# Patient Record
Sex: Male | Born: 1945
Health system: Southern US, Community
[De-identification: ages and names within clinical notes are randomized; demographics above are authoritative.]

## PROBLEM LIST (undated history)

## (undated) DIAGNOSIS — G4730 Sleep apnea, unspecified: Secondary | ICD-10-CM

## (undated) DIAGNOSIS — T4145XA Adverse effect of unspecified anesthetic, initial encounter: Secondary | ICD-10-CM

## (undated) DIAGNOSIS — I10 Essential (primary) hypertension: Secondary | ICD-10-CM

## (undated) DIAGNOSIS — M1990 Unspecified osteoarthritis, unspecified site: Secondary | ICD-10-CM

## (undated) DIAGNOSIS — J189 Pneumonia, unspecified organism: Secondary | ICD-10-CM

## (undated) DIAGNOSIS — L409 Psoriasis, unspecified: Secondary | ICD-10-CM

## (undated) DIAGNOSIS — T8859XA Other complications of anesthesia, initial encounter: Secondary | ICD-10-CM

## (undated) DIAGNOSIS — C801 Malignant (primary) neoplasm, unspecified: Secondary | ICD-10-CM

## (undated) DIAGNOSIS — N189 Chronic kidney disease, unspecified: Secondary | ICD-10-CM

## (undated) DIAGNOSIS — R001 Bradycardia, unspecified: Secondary | ICD-10-CM

## (undated) DIAGNOSIS — L400 Psoriasis vulgaris: Secondary | ICD-10-CM

## (undated) HISTORY — PX: COLONOSCOPY: SHX174

## (undated) HISTORY — PX: OTHER SURGICAL HISTORY: SHX169

## (undated) HISTORY — PX: JOINT REPLACEMENT: SHX530

---

## 2003-01-03 ENCOUNTER — Encounter: Payer: Self-pay | Admitting: Geriatric Medicine

## 2003-01-03 ENCOUNTER — Encounter: Admission: RE | Admit: 2003-01-03 | Discharge: 2003-01-03 | Payer: Self-pay | Admitting: Geriatric Medicine

## 2003-05-09 ENCOUNTER — Encounter: Admission: RE | Admit: 2003-05-09 | Discharge: 2003-05-09 | Payer: Self-pay | Admitting: Geriatric Medicine

## 2011-09-10 DIAGNOSIS — Z79899 Other long term (current) drug therapy: Secondary | ICD-10-CM | POA: Diagnosis not present

## 2011-09-10 DIAGNOSIS — I10 Essential (primary) hypertension: Secondary | ICD-10-CM | POA: Diagnosis not present

## 2011-09-10 DIAGNOSIS — J309 Allergic rhinitis, unspecified: Secondary | ICD-10-CM | POA: Diagnosis not present

## 2011-09-10 DIAGNOSIS — M76899 Other specified enthesopathies of unspecified lower limb, excluding foot: Secondary | ICD-10-CM | POA: Diagnosis not present

## 2011-09-17 DIAGNOSIS — L821 Other seborrheic keratosis: Secondary | ICD-10-CM | POA: Diagnosis not present

## 2011-09-17 DIAGNOSIS — L408 Other psoriasis: Secondary | ICD-10-CM | POA: Diagnosis not present

## 2011-09-17 DIAGNOSIS — Z85820 Personal history of malignant melanoma of skin: Secondary | ICD-10-CM | POA: Diagnosis not present

## 2011-11-06 DIAGNOSIS — E7800 Pure hypercholesterolemia, unspecified: Secondary | ICD-10-CM | POA: Diagnosis not present

## 2011-11-06 DIAGNOSIS — I10 Essential (primary) hypertension: Secondary | ICD-10-CM | POA: Diagnosis not present

## 2011-11-06 DIAGNOSIS — Z79899 Other long term (current) drug therapy: Secondary | ICD-10-CM | POA: Diagnosis not present

## 2011-11-06 DIAGNOSIS — Z1331 Encounter for screening for depression: Secondary | ICD-10-CM | POA: Diagnosis not present

## 2011-11-06 DIAGNOSIS — Z125 Encounter for screening for malignant neoplasm of prostate: Secondary | ICD-10-CM | POA: Diagnosis not present

## 2011-11-06 DIAGNOSIS — Z0000 Encounter for general adult medical examination without abnormal findings: Secondary | ICD-10-CM | POA: Diagnosis not present

## 2011-12-02 DIAGNOSIS — Z79899 Other long term (current) drug therapy: Secondary | ICD-10-CM | POA: Diagnosis not present

## 2012-01-30 DIAGNOSIS — Z85820 Personal history of malignant melanoma of skin: Secondary | ICD-10-CM | POA: Diagnosis not present

## 2012-01-30 DIAGNOSIS — L723 Sebaceous cyst: Secondary | ICD-10-CM | POA: Diagnosis not present

## 2012-03-06 DIAGNOSIS — Z79899 Other long term (current) drug therapy: Secondary | ICD-10-CM | POA: Diagnosis not present

## 2012-03-06 DIAGNOSIS — Z23 Encounter for immunization: Secondary | ICD-10-CM | POA: Diagnosis not present

## 2012-03-06 DIAGNOSIS — E7800 Pure hypercholesterolemia, unspecified: Secondary | ICD-10-CM | POA: Diagnosis not present

## 2012-03-06 DIAGNOSIS — I10 Essential (primary) hypertension: Secondary | ICD-10-CM | POA: Diagnosis not present

## 2012-03-24 DIAGNOSIS — D485 Neoplasm of uncertain behavior of skin: Secondary | ICD-10-CM | POA: Diagnosis not present

## 2012-03-24 DIAGNOSIS — L723 Sebaceous cyst: Secondary | ICD-10-CM | POA: Diagnosis not present

## 2012-03-24 DIAGNOSIS — L821 Other seborrheic keratosis: Secondary | ICD-10-CM | POA: Diagnosis not present

## 2012-03-24 DIAGNOSIS — Z85820 Personal history of malignant melanoma of skin: Secondary | ICD-10-CM | POA: Diagnosis not present

## 2012-04-09 DIAGNOSIS — H524 Presbyopia: Secondary | ICD-10-CM | POA: Diagnosis not present

## 2012-05-04 DIAGNOSIS — Z79899 Other long term (current) drug therapy: Secondary | ICD-10-CM | POA: Diagnosis not present

## 2012-05-04 DIAGNOSIS — E7800 Pure hypercholesterolemia, unspecified: Secondary | ICD-10-CM | POA: Diagnosis not present

## 2012-05-04 DIAGNOSIS — M25569 Pain in unspecified knee: Secondary | ICD-10-CM | POA: Diagnosis not present

## 2012-05-04 DIAGNOSIS — I10 Essential (primary) hypertension: Secondary | ICD-10-CM | POA: Diagnosis not present

## 2012-06-03 DIAGNOSIS — E7800 Pure hypercholesterolemia, unspecified: Secondary | ICD-10-CM | POA: Diagnosis not present

## 2012-06-03 DIAGNOSIS — I10 Essential (primary) hypertension: Secondary | ICD-10-CM | POA: Diagnosis not present

## 2012-06-03 DIAGNOSIS — Z79899 Other long term (current) drug therapy: Secondary | ICD-10-CM | POA: Diagnosis not present

## 2012-09-22 DIAGNOSIS — D239 Other benign neoplasm of skin, unspecified: Secondary | ICD-10-CM | POA: Diagnosis not present

## 2012-09-22 DIAGNOSIS — L570 Actinic keratosis: Secondary | ICD-10-CM | POA: Diagnosis not present

## 2012-09-22 DIAGNOSIS — L821 Other seborrheic keratosis: Secondary | ICD-10-CM | POA: Diagnosis not present

## 2012-09-22 DIAGNOSIS — Z85820 Personal history of malignant melanoma of skin: Secondary | ICD-10-CM | POA: Diagnosis not present

## 2012-09-30 DIAGNOSIS — I10 Essential (primary) hypertension: Secondary | ICD-10-CM | POA: Diagnosis not present

## 2012-09-30 DIAGNOSIS — J069 Acute upper respiratory infection, unspecified: Secondary | ICD-10-CM | POA: Diagnosis not present

## 2012-11-06 DIAGNOSIS — Z1331 Encounter for screening for depression: Secondary | ICD-10-CM | POA: Diagnosis not present

## 2012-11-06 DIAGNOSIS — Z0000 Encounter for general adult medical examination without abnormal findings: Secondary | ICD-10-CM | POA: Diagnosis not present

## 2012-11-06 DIAGNOSIS — I10 Essential (primary) hypertension: Secondary | ICD-10-CM | POA: Diagnosis not present

## 2012-11-06 DIAGNOSIS — Z125 Encounter for screening for malignant neoplasm of prostate: Secondary | ICD-10-CM | POA: Diagnosis not present

## 2012-11-06 DIAGNOSIS — E7800 Pure hypercholesterolemia, unspecified: Secondary | ICD-10-CM | POA: Diagnosis not present

## 2012-11-06 DIAGNOSIS — Z79899 Other long term (current) drug therapy: Secondary | ICD-10-CM | POA: Diagnosis not present

## 2012-12-07 DIAGNOSIS — Z85820 Personal history of malignant melanoma of skin: Secondary | ICD-10-CM | POA: Diagnosis not present

## 2012-12-07 DIAGNOSIS — D485 Neoplasm of uncertain behavior of skin: Secondary | ICD-10-CM | POA: Diagnosis not present

## 2012-12-07 DIAGNOSIS — L821 Other seborrheic keratosis: Secondary | ICD-10-CM | POA: Diagnosis not present

## 2013-01-30 DIAGNOSIS — Z23 Encounter for immunization: Secondary | ICD-10-CM | POA: Diagnosis not present

## 2013-03-24 DIAGNOSIS — L821 Other seborrheic keratosis: Secondary | ICD-10-CM | POA: Diagnosis not present

## 2013-03-24 DIAGNOSIS — L408 Other psoriasis: Secondary | ICD-10-CM | POA: Diagnosis not present

## 2013-03-24 DIAGNOSIS — L851 Acquired keratosis [keratoderma] palmaris et plantaris: Secondary | ICD-10-CM | POA: Diagnosis not present

## 2013-03-24 DIAGNOSIS — L819 Disorder of pigmentation, unspecified: Secondary | ICD-10-CM | POA: Diagnosis not present

## 2013-03-24 DIAGNOSIS — Z85820 Personal history of malignant melanoma of skin: Secondary | ICD-10-CM | POA: Diagnosis not present

## 2013-03-24 DIAGNOSIS — D485 Neoplasm of uncertain behavior of skin: Secondary | ICD-10-CM | POA: Diagnosis not present

## 2013-05-06 DIAGNOSIS — H524 Presbyopia: Secondary | ICD-10-CM | POA: Diagnosis not present

## 2013-05-07 DIAGNOSIS — I10 Essential (primary) hypertension: Secondary | ICD-10-CM | POA: Diagnosis not present

## 2013-05-07 DIAGNOSIS — E7800 Pure hypercholesterolemia, unspecified: Secondary | ICD-10-CM | POA: Diagnosis not present

## 2013-05-07 DIAGNOSIS — Z79899 Other long term (current) drug therapy: Secondary | ICD-10-CM | POA: Diagnosis not present

## 2013-05-07 DIAGNOSIS — M25569 Pain in unspecified knee: Secondary | ICD-10-CM | POA: Diagnosis not present

## 2013-06-29 DIAGNOSIS — M1710 Unilateral primary osteoarthritis, unspecified knee: Secondary | ICD-10-CM | POA: Diagnosis not present

## 2013-11-01 DIAGNOSIS — Z85820 Personal history of malignant melanoma of skin: Secondary | ICD-10-CM | POA: Diagnosis not present

## 2013-11-01 DIAGNOSIS — L821 Other seborrheic keratosis: Secondary | ICD-10-CM | POA: Diagnosis not present

## 2013-11-01 DIAGNOSIS — L570 Actinic keratosis: Secondary | ICD-10-CM | POA: Diagnosis not present

## 2013-11-01 DIAGNOSIS — D239 Other benign neoplasm of skin, unspecified: Secondary | ICD-10-CM | POA: Diagnosis not present

## 2013-11-10 DIAGNOSIS — Z0000 Encounter for general adult medical examination without abnormal findings: Secondary | ICD-10-CM | POA: Diagnosis not present

## 2013-11-10 DIAGNOSIS — Z23 Encounter for immunization: Secondary | ICD-10-CM | POA: Diagnosis not present

## 2013-11-10 DIAGNOSIS — Z125 Encounter for screening for malignant neoplasm of prostate: Secondary | ICD-10-CM | POA: Diagnosis not present

## 2013-11-10 DIAGNOSIS — Z1331 Encounter for screening for depression: Secondary | ICD-10-CM | POA: Diagnosis not present

## 2013-11-10 DIAGNOSIS — I10 Essential (primary) hypertension: Secondary | ICD-10-CM | POA: Diagnosis not present

## 2013-11-10 DIAGNOSIS — G4733 Obstructive sleep apnea (adult) (pediatric): Secondary | ICD-10-CM | POA: Diagnosis not present

## 2013-11-10 DIAGNOSIS — Z79899 Other long term (current) drug therapy: Secondary | ICD-10-CM | POA: Diagnosis not present

## 2013-11-10 DIAGNOSIS — E7800 Pure hypercholesterolemia, unspecified: Secondary | ICD-10-CM | POA: Diagnosis not present

## 2014-01-12 ENCOUNTER — Ambulatory Visit (HOSPITAL_BASED_OUTPATIENT_CLINIC_OR_DEPARTMENT_OTHER): Payer: Medicare Other | Attending: Internal Medicine | Admitting: Radiology

## 2014-01-12 VITALS — Ht 69.0 in | Wt 184.0 lb

## 2014-01-12 DIAGNOSIS — R0989 Other specified symptoms and signs involving the circulatory and respiratory systems: Secondary | ICD-10-CM | POA: Diagnosis not present

## 2014-01-12 DIAGNOSIS — G4733 Obstructive sleep apnea (adult) (pediatric): Secondary | ICD-10-CM | POA: Insufficient documentation

## 2014-01-12 DIAGNOSIS — R5383 Other fatigue: Secondary | ICD-10-CM

## 2014-01-12 DIAGNOSIS — R0683 Snoring: Secondary | ICD-10-CM

## 2014-01-12 DIAGNOSIS — R0609 Other forms of dyspnea: Secondary | ICD-10-CM | POA: Insufficient documentation

## 2014-01-12 DIAGNOSIS — G4761 Periodic limb movement disorder: Secondary | ICD-10-CM | POA: Diagnosis not present

## 2014-01-12 DIAGNOSIS — G4710 Hypersomnia, unspecified: Secondary | ICD-10-CM | POA: Diagnosis present

## 2014-01-12 DIAGNOSIS — G4730 Sleep apnea, unspecified: Secondary | ICD-10-CM | POA: Diagnosis present

## 2014-01-12 DIAGNOSIS — R454 Irritability and anger: Secondary | ICD-10-CM

## 2014-01-16 DIAGNOSIS — G4710 Hypersomnia, unspecified: Secondary | ICD-10-CM

## 2014-01-16 NOTE — Sleep Study (Signed)
   NAME: Adam Daniel DATE OF BIRTH:  [birth year] MEDICAL RECORD NUMBER [id number]  LOCATION: [facility]  PHYSICIAN: YOUNG,CLINTON D  DATE OF STUDY: 01/12/2014  SLEEP STUDY TYPE: Nocturnal Polysomnogram               REFERRING PHYSICIAN: Stoneking, Hal, MD  INDICATION FOR STUDY: Hypersomnia with sleep apnea  EPWORTH SLEEPINESS SCORE:   12/24 HEIGHT: 5\' 9"  (175.3 cm)  WEIGHT: 83.462 kg (184 lb)    Body mass index is 27.16 kg/(m^2).  NECK SIZE: 15.5 in.  MEDICATIONS: Charted for review  SLEEP ARCHITECTURE: Total sleep time 285.5 minutes with sleep efficiency 75.5%. Stage I was 10.2%, stage II 78.6%, stage III absent, REM 11.2% of total sleep time. Sleep latency 37.5 minutes, REM latency 171.5 minutes, awake after sleep onset 55 minutes, arousal index 26.1, bedtime medication: None  RESPIRATORY DATA: Apnea hypopneas index (AHI) 34 per hour. 162 total events scored including 23 obstructive apneas, 34 central apneas, 1 mixed apnea, 104 hypopneas. The events in all positions, especially nonsupine. REM AHI 18.8 per hour. He had insufficient early events to meet protocol requirements for split CPAP titration on this study.  OXYGEN DATA: Moderately loud snoring with oxygen desaturation to a nadir of 85% and mean saturation 91.8% on room air.  CARDIAC DATA: Sinus rhythm with PVCs and PACs  MOVEMENT/PARASOMNIA: Periodic limb movement. A total of 266 limb jerks counted of which 17 were associated with arousal or wakening for periodic limb movement with arousal index of 3.6 per hour. Bathroom x2  IMPRESSION/ RECOMMENDATION:   1) Severe obstructive sleep apnea/hypopneas syndrome, AHI 34 per hour with non-positional events. REM AHI 18.8 per hour. Moderately loud snoring with oxygen desaturation to a nadir of 85% and mean saturation 91.8% on room air. 2) He did not have enough events in the first hours of the study to meet protocol criteria for split CPAP titration. This  patient can return for a dedicated CPAP titration study if appropriate. 3) Frequent limb jerks, meeting criteria for mild Periodic Limb Movement Syndrome. 266 limb jerks were counted of which 17 were associated with arousal or wakening for a periodic limb movement with arousal index of 3.6 per hour. Sometimes we find that limb jerks are markedly reduced when sleep apnea is controlled, indicating they are related to an arousal response triggered by respiratory events. If persistent in the home environment, then specific therapy such as Requip or Mirapex might be considered if appropriate. Noted also, bathroom x2 representing additional sleep disturbance.  Deneise Lever Diplomate, American Board of Sleep Medicine  ELECTRONICALLY SIGNED ON:  01/16/2014, 2:14 PM [place] PH: [phone]   FX: [phone] [place]

## 2014-03-04 ENCOUNTER — Other Ambulatory Visit: Payer: Self-pay | Admitting: Geriatric Medicine

## 2014-03-04 ENCOUNTER — Ambulatory Visit
Admission: RE | Admit: 2014-03-04 | Discharge: 2014-03-04 | Disposition: A | Discharge status: Home / Self Care | Payer: Medicare Other | Source: Ambulatory Visit | Attending: Geriatric Medicine | Admitting: Geriatric Medicine

## 2014-03-04 DIAGNOSIS — J069 Acute upper respiratory infection, unspecified: Secondary | ICD-10-CM | POA: Diagnosis not present

## 2014-03-04 DIAGNOSIS — R05 Cough: Secondary | ICD-10-CM

## 2014-03-04 DIAGNOSIS — R509 Fever, unspecified: Secondary | ICD-10-CM | POA: Diagnosis not present

## 2014-03-04 DIAGNOSIS — R059 Cough, unspecified: Secondary | ICD-10-CM

## 2014-03-13 DIAGNOSIS — B029 Zoster without complications: Secondary | ICD-10-CM | POA: Diagnosis not present

## 2014-04-06 ENCOUNTER — Ambulatory Visit (HOSPITAL_BASED_OUTPATIENT_CLINIC_OR_DEPARTMENT_OTHER): Payer: Medicare Other | Attending: Geriatric Medicine | Admitting: Sleep Medicine

## 2014-04-06 VITALS — Ht 69.0 in | Wt 184.0 lb

## 2014-04-06 DIAGNOSIS — G4710 Hypersomnia, unspecified: Secondary | ICD-10-CM | POA: Insufficient documentation

## 2014-04-06 DIAGNOSIS — Z6827 Body mass index (BMI) 27.0-27.9, adult: Secondary | ICD-10-CM | POA: Insufficient documentation

## 2014-04-06 DIAGNOSIS — G4730 Sleep apnea, unspecified: Secondary | ICD-10-CM | POA: Diagnosis not present

## 2014-04-06 DIAGNOSIS — G4733 Obstructive sleep apnea (adult) (pediatric): Secondary | ICD-10-CM

## 2014-04-10 DIAGNOSIS — G4733 Obstructive sleep apnea (adult) (pediatric): Secondary | ICD-10-CM | POA: Diagnosis not present

## 2014-04-10 NOTE — Sleep Study (Signed)
   NAME: Adam Daniel DATE OF BIRTH:  [birth year] MEDICAL RECORD NUMBER [id number]  LOCATION: [facility]  PHYSICIAN: Alandis Bluemel D  DATE OF STUDY: 04/06/2014  SLEEP STUDY TYPE: Nocturnal Polysomnogram               REFERRING PHYSICIAN: Stoneking, Hal, MD  INDICATION FOR STUDY: Hypersomnia with sleep apnea-CPAP titration  EPWORTH SLEEPINESS SCORE:   12/24 HEIGHT: 5\' 9"  (175.3 cm)  WEIGHT: 184 lb (83.462 kg)    Body mass index is 27.16 kg/(m^2).  NECK SIZE:   15.5 in.  MEDICATIONS: Charted for review  SLEEP ARCHITECTURE: Total sleep time 332.5 minutes with sleep efficiency 76.8%. Stage I was 4.1%, stage II 59.1%, stage III 15.8%, REM 21.1% of total sleep time. Sleep latency 22 minutes, REM latency 154 minutes, awake after sleep onset 75 minutes, arousal index 16.6, bedtime medication: None  RESPIRATORY DATA: CPAP titration protocol. CPAP was titrated to 9 CWP, AHI 0 per hour. He wore a medium fullface mask.  OXYGEN DATA: Snoring was prevented at final CPAP with mean oxygen saturation 94.1% on room air.  CARDIAC DATA: Sinus rhythm  MOVEMENT/PARASOMNIA: Periodic limb movement. A total of 498 limb jerks were counted of which 67 were associated with arousal or awakening for a periodic limb movement with arousal index of 12.1 per hour. Bathroom 2.  IMPRESSION/ RECOMMENDATION:   1) Successful CPAP titration to 9 CWP, AHI 0 per hour. He wore a medium ResMed fullface mask with heated humidifier. Snoring was prevented and mean oxygen saturation was 94.1% on room air. 2) Significant periodic limb movement syndrome. A total of 498 limb jerks were counted of which 67 were associated with arousal or awakening for a periodic limb movement with arousal index of 12.1 per hour. The frequency of limb jerks often goes up during CPAP titration. If this pattern persists and the home environment once he has adapted to CPAP, then a trial specific therapy such as Requip or  Mirapex might be considered, if appropriate.  3) Baseline polysomnogram on 01/12/2014 recorded AHI 34 per hour. Body weight was 184 pounds for that study.  Deneise Lever Diplomate, American Board of Sleep Medicine  ELECTRONICALLY SIGNED ON:  04/10/2014, 10:16 AM [place] PH: [phone]   FX: [phone] [place]

## 2014-05-18 DIAGNOSIS — G4733 Obstructive sleep apnea (adult) (pediatric): Secondary | ICD-10-CM | POA: Diagnosis not present

## 2014-05-18 DIAGNOSIS — I10 Essential (primary) hypertension: Secondary | ICD-10-CM | POA: Diagnosis not present

## 2014-05-18 DIAGNOSIS — L821 Other seborrheic keratosis: Secondary | ICD-10-CM | POA: Diagnosis not present

## 2014-07-20 DIAGNOSIS — I10 Essential (primary) hypertension: Secondary | ICD-10-CM | POA: Diagnosis not present

## 2014-07-20 DIAGNOSIS — G4730 Sleep apnea, unspecified: Secondary | ICD-10-CM | POA: Diagnosis not present

## 2014-08-09 ENCOUNTER — Encounter: Payer: Self-pay | Admitting: Internal Medicine

## 2014-11-10 DIAGNOSIS — D2272 Melanocytic nevi of left lower limb, including hip: Secondary | ICD-10-CM | POA: Diagnosis not present

## 2014-11-10 DIAGNOSIS — L821 Other seborrheic keratosis: Secondary | ICD-10-CM | POA: Diagnosis not present

## 2014-11-10 DIAGNOSIS — L400 Psoriasis vulgaris: Secondary | ICD-10-CM | POA: Diagnosis not present

## 2014-11-10 DIAGNOSIS — D225 Melanocytic nevi of trunk: Secondary | ICD-10-CM | POA: Diagnosis not present

## 2014-11-10 DIAGNOSIS — Z85820 Personal history of malignant melanoma of skin: Secondary | ICD-10-CM | POA: Diagnosis not present

## 2014-12-28 DIAGNOSIS — Z0000 Encounter for general adult medical examination without abnormal findings: Secondary | ICD-10-CM | POA: Diagnosis not present

## 2014-12-28 DIAGNOSIS — Z1389 Encounter for screening for other disorder: Secondary | ICD-10-CM | POA: Diagnosis not present

## 2014-12-28 DIAGNOSIS — G4730 Sleep apnea, unspecified: Secondary | ICD-10-CM | POA: Diagnosis not present

## 2014-12-28 DIAGNOSIS — M25561 Pain in right knee: Secondary | ICD-10-CM | POA: Diagnosis not present

## 2014-12-28 DIAGNOSIS — Z79899 Other long term (current) drug therapy: Secondary | ICD-10-CM | POA: Diagnosis not present

## 2014-12-28 DIAGNOSIS — H9319 Tinnitus, unspecified ear: Secondary | ICD-10-CM | POA: Diagnosis not present

## 2014-12-29 DIAGNOSIS — Z125 Encounter for screening for malignant neoplasm of prostate: Secondary | ICD-10-CM | POA: Diagnosis not present

## 2014-12-29 DIAGNOSIS — Z79899 Other long term (current) drug therapy: Secondary | ICD-10-CM | POA: Diagnosis not present

## 2014-12-29 DIAGNOSIS — Z0000 Encounter for general adult medical examination without abnormal findings: Secondary | ICD-10-CM | POA: Diagnosis not present

## 2015-01-23 DIAGNOSIS — Z79899 Other long term (current) drug therapy: Secondary | ICD-10-CM | POA: Diagnosis not present

## 2015-03-27 DIAGNOSIS — Z1389 Encounter for screening for other disorder: Secondary | ICD-10-CM | POA: Diagnosis not present

## 2015-03-27 DIAGNOSIS — Z79899 Other long term (current) drug therapy: Secondary | ICD-10-CM | POA: Diagnosis not present

## 2015-03-27 DIAGNOSIS — I10 Essential (primary) hypertension: Secondary | ICD-10-CM | POA: Diagnosis not present

## 2015-04-04 DIAGNOSIS — Z23 Encounter for immunization: Secondary | ICD-10-CM | POA: Diagnosis not present

## 2015-04-18 DIAGNOSIS — S0101XA Laceration without foreign body of scalp, initial encounter: Secondary | ICD-10-CM | POA: Diagnosis not present

## 2015-04-21 DIAGNOSIS — Z4802 Encounter for removal of sutures: Secondary | ICD-10-CM | POA: Diagnosis not present

## 2015-05-18 DIAGNOSIS — Z79899 Other long term (current) drug therapy: Secondary | ICD-10-CM | POA: Diagnosis not present

## 2015-06-02 DIAGNOSIS — M1711 Unilateral primary osteoarthritis, right knee: Secondary | ICD-10-CM | POA: Diagnosis not present

## 2015-07-05 DIAGNOSIS — M1711 Unilateral primary osteoarthritis, right knee: Secondary | ICD-10-CM | POA: Diagnosis not present

## 2015-09-25 DIAGNOSIS — N183 Chronic kidney disease, stage 3 (moderate): Secondary | ICD-10-CM | POA: Diagnosis not present

## 2015-09-25 DIAGNOSIS — I129 Hypertensive chronic kidney disease with stage 1 through stage 4 chronic kidney disease, or unspecified chronic kidney disease: Secondary | ICD-10-CM | POA: Diagnosis not present

## 2015-10-30 DIAGNOSIS — M1711 Unilateral primary osteoarthritis, right knee: Secondary | ICD-10-CM | POA: Diagnosis not present

## 2015-11-15 DIAGNOSIS — D1801 Hemangioma of skin and subcutaneous tissue: Secondary | ICD-10-CM | POA: Diagnosis not present

## 2015-11-15 DIAGNOSIS — D225 Melanocytic nevi of trunk: Secondary | ICD-10-CM | POA: Diagnosis not present

## 2015-11-15 DIAGNOSIS — D692 Other nonthrombocytopenic purpura: Secondary | ICD-10-CM | POA: Diagnosis not present

## 2015-11-15 DIAGNOSIS — L570 Actinic keratosis: Secondary | ICD-10-CM | POA: Diagnosis not present

## 2015-11-15 DIAGNOSIS — Z85820 Personal history of malignant melanoma of skin: Secondary | ICD-10-CM | POA: Diagnosis not present

## 2015-11-15 DIAGNOSIS — L821 Other seborrheic keratosis: Secondary | ICD-10-CM | POA: Diagnosis not present

## 2015-11-15 DIAGNOSIS — D485 Neoplasm of uncertain behavior of skin: Secondary | ICD-10-CM | POA: Diagnosis not present

## 2015-11-15 DIAGNOSIS — D2272 Melanocytic nevi of left lower limb, including hip: Secondary | ICD-10-CM | POA: Diagnosis not present

## 2015-12-20 DIAGNOSIS — H2513 Age-related nuclear cataract, bilateral: Secondary | ICD-10-CM | POA: Diagnosis not present

## 2016-01-04 DIAGNOSIS — B9789 Other viral agents as the cause of diseases classified elsewhere: Secondary | ICD-10-CM | POA: Diagnosis not present

## 2016-01-04 DIAGNOSIS — J069 Acute upper respiratory infection, unspecified: Secondary | ICD-10-CM | POA: Diagnosis not present

## 2016-01-04 DIAGNOSIS — Z23 Encounter for immunization: Secondary | ICD-10-CM | POA: Diagnosis not present

## 2016-01-04 DIAGNOSIS — Z0000 Encounter for general adult medical examination without abnormal findings: Secondary | ICD-10-CM | POA: Diagnosis not present

## 2016-01-04 DIAGNOSIS — E7800 Pure hypercholesterolemia, unspecified: Secondary | ICD-10-CM | POA: Diagnosis not present

## 2016-01-04 DIAGNOSIS — I129 Hypertensive chronic kidney disease with stage 1 through stage 4 chronic kidney disease, or unspecified chronic kidney disease: Secondary | ICD-10-CM | POA: Diagnosis not present

## 2016-01-04 DIAGNOSIS — N183 Chronic kidney disease, stage 3 (moderate): Secondary | ICD-10-CM | POA: Diagnosis not present

## 2016-01-04 DIAGNOSIS — Z79899 Other long term (current) drug therapy: Secondary | ICD-10-CM | POA: Diagnosis not present

## 2016-01-04 DIAGNOSIS — Z125 Encounter for screening for malignant neoplasm of prostate: Secondary | ICD-10-CM | POA: Diagnosis not present

## 2016-01-04 DIAGNOSIS — F325 Major depressive disorder, single episode, in full remission: Secondary | ICD-10-CM | POA: Diagnosis not present

## 2016-01-29 DIAGNOSIS — D490 Neoplasm of unspecified behavior of digestive system: Secondary | ICD-10-CM | POA: Diagnosis not present

## 2016-01-29 DIAGNOSIS — D120 Benign neoplasm of cecum: Secondary | ICD-10-CM | POA: Diagnosis not present

## 2016-01-29 DIAGNOSIS — K621 Rectal polyp: Secondary | ICD-10-CM | POA: Diagnosis not present

## 2016-01-29 DIAGNOSIS — Z86010 Personal history of colonic polyps: Secondary | ICD-10-CM | POA: Diagnosis not present

## 2016-01-29 DIAGNOSIS — D126 Benign neoplasm of colon, unspecified: Secondary | ICD-10-CM | POA: Diagnosis not present

## 2016-01-29 DIAGNOSIS — D123 Benign neoplasm of transverse colon: Secondary | ICD-10-CM | POA: Diagnosis not present

## 2016-01-30 DIAGNOSIS — D126 Benign neoplasm of colon, unspecified: Secondary | ICD-10-CM | POA: Diagnosis not present

## 2016-02-13 ENCOUNTER — Ambulatory Visit: Payer: Self-pay | Admitting: General Surgery

## 2016-02-13 DIAGNOSIS — D374 Neoplasm of uncertain behavior of colon: Secondary | ICD-10-CM | POA: Diagnosis not present

## 2016-02-13 NOTE — H&P (Signed)
Adam Daniel 02/13/2016 3:05 PM Location: [facility] Patient #: [id number] DOB: [birth year] Married / Language: English / Race: White Male   History of Present Illness Adam Hollingshead MD; 02/13/2016 4:22 PM) The patient is a 70 year old male.  Note:He is referred by Dr. Teena Irani for consultation regarding a 3 cm tubular adenoma with high-grade dysplasia in the transverse colon. He has a history of colon polyps and gets a colonoscopy every 5 years. Dr. Amedeo Plenty performed one this year and he found a mass in the transverse colon that was not obstructing. This was partially circumferential. It measured 3 cm in length. Biopsy was performed. The mass could not be completely removed given its nature. Final pathology is as above. The only family history of colon cancer was in his grandfather. Other polyps were found but these were benign. The area of the transverse colon lesion was marked with [place] ink. He is sent here to discuss partial colectomy. His wife and his daughter are with him. He does have approximately 3 bowel movements a day and sometimes does not empty completely.  Past medical history is notable for hypertension, sleep apnea, hypercholesterolemia, history of melanoma of the left shoulder  Other Problems (April Staton, CMA; 02/13/2016 3:05 PM) Arthritis Back Pain High blood pressure Hypercholesterolemia Melanoma Sleep Apnea  Past Surgical History (April Staton, CMA; 02/13/2016 3:05 PM) Colon Polyp Removal - Colonoscopy Oral Surgery  Diagnostic Studies History (April Staton, [place]; 02/13/2016 3:05 PM) Colonoscopy within last year  Allergies (April Staton, [place]; 02/13/2016 3:06 PM) No Known Drug Allergies10/24/2017  Medication History (April Staton, CMA; 02/13/2016 3:10 PM) AmLODIPine Besylate (10MG  Tablet, Oral) Active. Citalopram Hydrobromide (20MG  Tablet, Oral) Active. Rosuvastatin Calcium (5MG  Tablet, Oral) Active. Losartan  Potassium (100MG  Tablet, Oral) Active. Garlic (100MG  Tablet, Oral) Active. Cinnamon (500MG  Tablet, Oral) Active. Vitamin D (Cholecalciferol) (1000UNIT Tablet, Oral) Active. Loratadine (10MG  (Rapid) Tablet, Oral) Active. Levitra (10MG  Tablet, Oral) Active. Aspirin (81MG  Tablet, Oral) Active. Centrum Silver 50+Men (Oral) Active. Glucosamine Complex (1500 Oral) Active. CoQ10 (400MG  Capsule, Oral) Active. Triamcinolone Acetonide (Top) (0.025% Cream, External) Active. Flonase (50MCG/ACT Suspension, Nasal) Active. Medications Reconciled  Social History (April Staton, CMA; 02/13/2016 3:05 PM) Alcohol use Moderate alcohol use. Caffeine use Coffee. No drug use Tobacco use Never smoker.  Family History (April Staton, [place]; 02/13/2016 3:05 PM) Arthritis Father. Colon Polyps Brother, Sister. Diabetes Mellitus Brother, Father, Mother, Sister. Heart Disease Brother, Father, Mother, Sister. Heart disease in male family member before age 91 Hypertension Brother, Daughter, Father, Mother, Sister. Kidney Disease Sister. Migraine Headache Daughter. Prostate Cancer Father.    Review of Systems (April Staton CMA; 02/13/2016 3:05 PM) General Not Present- Appetite Loss, Chills, Fatigue, Fever, Night Sweats, Weight Gain and Weight Loss. Skin Not Present- Change in Wart/Mole, Dryness, Hives, Jaundice, New Lesions, Non-Healing Wounds, Rash and Ulcer. HEENT Present- Ringing in the Ears, Seasonal Allergies and Wears glasses/contact lenses. Not Present- Earache, Hearing Loss, Hoarseness, Nose Bleed, Oral Ulcers, Sinus Pain, Sore Throat, Visual Disturbances and Yellow Eyes. Respiratory Not Present- Bloody sputum, Chronic Cough, Difficulty Breathing, Snoring and Wheezing. Cardiovascular Not Present- Chest Pain, Difficulty Breathing Lying Down, Leg Cramps, Palpitations, Rapid Heart Rate, Shortness of Breath and Swelling of Extremities. Gastrointestinal Not Present- Abdominal Pain,  Bloating, Bloody Stool, Change in Bowel Habits, Chronic diarrhea, Constipation, Difficulty Swallowing, Excessive gas, Gets full quickly at meals, Hemorrhoids, Indigestion, Nausea, Rectal Pain and Vomiting. Male Genitourinary Not Present- Blood in Urine, Change in Urinary Stream, Frequency, Impotence, Nocturia, Painful Urination, Urgency and Urine  Leakage. Musculoskeletal Not Present- Back Pain, Joint Pain, Joint Stiffness, Muscle Pain, Muscle Weakness and Swelling of Extremities. Neurological Not Present- Decreased Memory, Fainting, Headaches, Numbness, Seizures, Tingling, Tremor, Trouble walking and Weakness. Psychiatric Not Present- Anxiety, Bipolar, Change in Sleep Pattern, Depression, Fearful and Frequent crying. Endocrine Not Present- Cold Intolerance, Excessive Hunger, Hair Changes, Heat Intolerance, Hot flashes and New Diabetes. Hematology Not Present- Blood Thinners, Easy Bruising, Excessive bleeding, Gland problems, HIV and Persistent Infections.  Vitals (April Staton CMA; 02/13/2016 3:11 PM) 02/13/2016 3:11 PM Weight: 187.25 lb Height: 69in Height was reported by patient. Body Surface Area: 2.01 m Body Mass Index: 27.65 kg/m  Temp.: 98.21F(Oral)  Pulse: 63 (Regular)  P.OX: 95% (Room air) BP: 130/74 (Sitting, Left Arm, Standard)       Physical Exam Adam Hollingshead MD; 02/13/2016 4:25 PM) The physical exam findings are as follows: Note:General: WDWN in NAD. Pleasant and cooperative.  HEENT: [place]/AT, no external nasal or ear masses, mucous membranes are moist  EYES: EOMI, no scleral icterus, pupils normal  NECK: Supple, no obvious mass or thyroid mass/enlargement, no trachea deviation  CV: RRR, no murmur, no edema  CHEST: Breath sounds equal and clear. Respirations nonlabored.  ABDOMEN: Soft, nontender, nondistended, no masses, no organomegaly, active bowel sounds, no scars, no hernias.  MUSCULOSKELETAL: FROM, good muscle tone, no edema, no venous stasis  changes, normal station and gait. left shoulder scar  LYMPHATIC: No palpable cervical, supraclavicular.  SKIN: No jaundice or suspicious rashes.  NEUROLOGIC: Alert and oriented, answers questions appropriately, normal gait and station.  PSYCHIATRIC: Normal mood, affect , and behavior.    Assessment & Plan Adam Hollingshead MD; 02/13/2016 4:25 PM) NEOPLASM OF UNCERTAIN BEHAVIOR OF TRANSVERSE COLON (D37.4) Impression: This is a tubulovillous adenoma with high-grade dysplasia. Given the size of the pathology, there is an increased risk of this harboring cancer. I recommend a laparoscopic assisted partial colectomy and he is in agreement with this.  Plan: Laparoscopic-assisted partial colectomy. I have explained the procedure, aftercare, and risks of colon resection. Risks include but are not limited to bleeding, infection, wound problems, anesthesia, anastomotic leak, need for colostomy, need for reoperative surgery, injury to intraabominal organs (such as intestine, spleen, kidney, bladder, ureter, etc.), ileus, irregular bowel habits. He seems to understand and agrees to proceed. Preoperative bowel preparation instructions will be given to him.  Jackolyn Confer, M.D.

## 2016-02-14 ENCOUNTER — Ambulatory Visit: Payer: Self-pay | Admitting: General Surgery

## 2016-03-06 DIAGNOSIS — M1711 Unilateral primary osteoarthritis, right knee: Secondary | ICD-10-CM | POA: Diagnosis not present

## 2016-03-07 ENCOUNTER — Ambulatory Visit
Admission: RE | Admit: 2016-03-07 | Discharge: 2016-03-07 | Disposition: A | Discharge status: Home / Self Care | Payer: Medicare Other | Source: Ambulatory Visit | Attending: Geriatric Medicine | Admitting: Geriatric Medicine

## 2016-03-07 ENCOUNTER — Other Ambulatory Visit: Payer: Self-pay | Admitting: Geriatric Medicine

## 2016-03-07 DIAGNOSIS — R918 Other nonspecific abnormal finding of lung field: Secondary | ICD-10-CM | POA: Diagnosis not present

## 2016-03-07 DIAGNOSIS — R001 Bradycardia, unspecified: Secondary | ICD-10-CM | POA: Diagnosis not present

## 2016-03-07 DIAGNOSIS — Z01818 Encounter for other preprocedural examination: Secondary | ICD-10-CM | POA: Diagnosis not present

## 2016-03-07 DIAGNOSIS — Z79899 Other long term (current) drug therapy: Secondary | ICD-10-CM | POA: Diagnosis not present

## 2016-03-25 NOTE — Patient Instructions (Addendum)
Adam Daniel  03/25/2016   Your procedure is scheduled on: 03/28/2016    Report to [facility] Main  Entrance take [place]  elevators to 3rd floor to  [place] at    [place] AM.  Call this number if you have problems the morning of surgery [phone]   Remember: ONLY 1 PERSON MAY GO WITH YOU TO SHORT STAY TO GET  READY MORNING OF [place].  Do not eat food or drink liquids :After Midnight.     Take these medicines the morning of surgery with A SIP OF WATER: Amlodipine ( NOrvasc), Celexa, Flonase                                 You may not have any metal on your body including hair pins and              piercings  Do not wear jewelry, , lotions, powders or perfumes, deodorant                         Men may shave face and neck.   Do not bring valuables to the hospital. [place].  Contacts, dentures or bridgework may not be worn into surgery.  Leave suitcase in the car. After surgery it may be brought to your room.        Special Instructions: N/A              Please read over the following fact sheets you were given: _____________________________________________________________________             [facility] - Preparing for Surgery Before surgery, you can play an important role.  Because skin is not sterile, your skin needs to be as free of germs as possible.  You can reduce the number of germs on your skin by washing with CHG (chlorahexidine gluconate) soap before surgery.  CHG is an antiseptic cleaner which kills germs and bonds with the skin to continue killing germs even after washing. Please DO NOT use if you have an allergy to CHG or antibacterial soaps.  If your skin becomes reddened/irritated stop using the CHG and inform your nurse when you arrive at Short Stay. Do not shave (including legs and underarms) for at least 48 hours prior to the first CHG shower.  You may shave your  face/neck. Please follow these instructions carefully:  1.  Shower with CHG Soap the night before surgery and the  morning of Surgery.  2.  If you choose to wash your hair, wash your hair first as usual with your  normal  shampoo.  3.  After you shampoo, rinse your hair and body thoroughly to remove the  shampoo.                           4.  Use CHG as you would any other liquid soap.  You can apply chg directly  to the skin and wash                       Gently with a scrungie or clean washcloth.  5.  Apply the CHG Soap  to your body ONLY FROM THE NECK DOWN.   Do not use on face/ open                           Wound or open sores. Avoid contact with eyes, ears mouth and genitals (private parts).                       Wash face,  Genitals (private parts) with your normal soap.             6.  Wash thoroughly, paying special attention to the area where your surgery  will be performed.  7.  Thoroughly rinse your body with warm water from the neck down.  8.  DO NOT shower/wash with your normal soap after using and rinsing off  the CHG Soap.                9.  Pat yourself dry with a clean towel.            10.  Wear clean pajamas.            11.  Place clean sheets on your bed the night of your first shower and do not  sleep with pets. Day of Surgery : Do not apply any lotions/deodorants the morning of surgery.  Please wear clean clothes to the hospital/surgery center.  FAILURE TO FOLLOW THESE INSTRUCTIONS MAY RESULT IN THE CANCELLATION OF YOUR SURGERY PATIENT SIGNATURE_________________________________  NURSE SIGNATURE__________________________________  ________________________________________________________________________  WHAT IS A BLOOD TRANSFUSION? Blood Transfusion Information  A transfusion is the replacement of blood or some of its parts. Blood is made up of multiple cells which provide different functions.  Red blood cells carry oxygen and are used for blood loss  replacement.  White blood cells fight against infection.  Platelets control bleeding.  Plasma helps clot blood.  Other blood products are available for specialized needs, such as hemophilia or other clotting disorders. BEFORE THE TRANSFUSION  Who gives blood for transfusions?   Healthy volunteers who are fully evaluated to make sure their blood is safe. This is blood bank blood. Transfusion therapy is the safest it has ever been in the practice of medicine. Before blood is taken from a donor, a complete history is taken to make sure that person has no history of diseases nor engages in risky social behavior (examples are intravenous drug use or sexual activity with multiple partners). The donor's travel history is screened to minimize risk of transmitting infections, such as malaria. The donated blood is tested for signs of infectious diseases, such as HIV and hepatitis. The blood is then tested to be sure it is compatible with you in order to minimize the chance of a transfusion reaction. If you or a relative donates blood, this is often done in anticipation of surgery and is not appropriate for emergency situations. It takes many days to process the donated blood. RISKS AND COMPLICATIONS Although transfusion therapy is very safe and saves many lives, the main dangers of transfusion include:   Getting an infectious disease.  Developing a transfusion reaction. This is an allergic reaction to something in the blood you were given. Every precaution is taken to prevent this. The decision to have a blood transfusion has been considered carefully by your caregiver before blood is given. Blood is not given unless the benefits outweigh the risks. AFTER THE TRANSFUSION  Right after receiving a blood transfusion, you will  usually feel much better and more energetic. This is especially true if your red blood cells have gotten low (anemic). The transfusion raises the level of the red blood cells which  carry oxygen, and this usually causes an energy increase.  The nurse administering the transfusion will monitor you carefully for complications. HOME CARE INSTRUCTIONS  No special instructions are needed after a transfusion. You may find your energy is better. Speak with your caregiver about any limitations on activity for underlying diseases you may have. SEEK MEDICAL CARE IF:   Your condition is not improving after your transfusion.  You develop redness or irritation at the intravenous (IV) site. SEEK IMMEDIATE MEDICAL CARE IF:  Any of the following symptoms occur over the next 12 hours:  Shaking chills.  You have a temperature by mouth above 102 F (38.9 C), not controlled by medicine.  Chest, back, or muscle pain.  People around you feel you are not acting correctly or are confused.  Shortness of breath or difficulty breathing.  Dizziness and fainting.  You get a rash or develop hives.  You have a decrease in urine output.  Your urine turns a dark color or changes to pink, red, or brown. Any of the following symptoms occur over the next 10 days:  You have a temperature by mouth above 102 F (38.9 C), not controlled by medicine.  Shortness of breath.  Weakness after normal activity.  The white part of the eye turns yellow (jaundice).  You have a decrease in the amount of urine or are urinating less often.  Your urine turns a dark color or changes to pink, red, or brown. Document Released: 04/05/2000 Document Revised: 07/01/2011 Document Reviewed: 11/23/2007 ExitCare Patient Information 2014 ExitCare, [place].  _______________________________________________________________________   CLEAR LIQUID DIET   Foods Allowed                                                                     Foods Excluded  Coffee and tea, regular and decaf                             liquids that you cannot  Plain Jell-O in any flavor                                             see  through such as: Fruit ices (not with fruit pulp)                                     milk, soups, orange juice  Iced Popsicles                                    All solid food Carbonated beverages, regular and diet  Cranberry, grape and apple juices Sports drinks like Gatorade Lightly seasoned clear broth or consume(fat free) Sugar, honey syrup  Sample Menu Breakfast                                Lunch                                     Supper Cranberry juice                    Beef broth                            Chicken broth Jell-O                                     Grape juice                           Apple juice Coffee or tea                        Jell-O                                      Popsicle                                                Coffee or tea                        Coffee or tea  _____________________________________________________________________

## 2016-03-26 ENCOUNTER — Encounter (HOSPITAL_COMMUNITY)
Admission: RE | Admit: 2016-03-26 | Discharge: 2016-03-26 | Disposition: A | Discharge status: Home / Self Care | Payer: Medicare Other | Source: Ambulatory Visit | Attending: General Surgery | Admitting: General Surgery

## 2016-03-26 ENCOUNTER — Encounter (HOSPITAL_COMMUNITY): Payer: Self-pay

## 2016-03-26 DIAGNOSIS — G4733 Obstructive sleep apnea (adult) (pediatric): Secondary | ICD-10-CM | POA: Diagnosis not present

## 2016-03-26 DIAGNOSIS — Z86010 Personal history of colonic polyps: Secondary | ICD-10-CM | POA: Diagnosis not present

## 2016-03-26 DIAGNOSIS — Z800 Family history of malignant neoplasm of digestive organs: Secondary | ICD-10-CM | POA: Diagnosis not present

## 2016-03-26 DIAGNOSIS — C184 Malignant neoplasm of transverse colon: Secondary | ICD-10-CM | POA: Diagnosis not present

## 2016-03-26 DIAGNOSIS — Z01812 Encounter for preprocedural laboratory examination: Secondary | ICD-10-CM

## 2016-03-26 DIAGNOSIS — Z01810 Encounter for preprocedural cardiovascular examination: Secondary | ICD-10-CM

## 2016-03-26 DIAGNOSIS — Z79899 Other long term (current) drug therapy: Secondary | ICD-10-CM | POA: Diagnosis not present

## 2016-03-26 DIAGNOSIS — D490 Neoplasm of unspecified behavior of digestive system: Secondary | ICD-10-CM | POA: Insufficient documentation

## 2016-03-26 DIAGNOSIS — M1990 Unspecified osteoarthritis, unspecified site: Secondary | ICD-10-CM | POA: Diagnosis not present

## 2016-03-26 DIAGNOSIS — N183 Chronic kidney disease, stage 3 (moderate): Secondary | ICD-10-CM | POA: Diagnosis not present

## 2016-03-26 DIAGNOSIS — I129 Hypertensive chronic kidney disease with stage 1 through stage 4 chronic kidney disease, or unspecified chronic kidney disease: Secondary | ICD-10-CM | POA: Diagnosis not present

## 2016-03-26 DIAGNOSIS — Z85820 Personal history of malignant melanoma of skin: Secondary | ICD-10-CM | POA: Diagnosis not present

## 2016-03-26 DIAGNOSIS — Z7982 Long term (current) use of aspirin: Secondary | ICD-10-CM | POA: Diagnosis not present

## 2016-03-26 HISTORY — DX: Malignant (primary) neoplasm, unspecified: C80.1

## 2016-03-26 HISTORY — DX: Unspecified osteoarthritis, unspecified site: M19.90

## 2016-03-26 HISTORY — DX: Essential (primary) hypertension: I10

## 2016-03-26 HISTORY — DX: Sleep apnea, unspecified: G47.30

## 2016-03-26 HISTORY — DX: Chronic kidney disease, unspecified: N18.9

## 2016-03-26 HISTORY — DX: Pneumonia, unspecified organism: J18.9

## 2016-03-26 HISTORY — DX: Psoriasis, unspecified: L40.9

## 2016-03-26 LAB — COMPREHENSIVE METABOLIC PANEL
ALT: 23 U/L (ref 17–63)
AST: 22 U/L (ref 15–41)
Albumin: 4.4 g/dL (ref 3.5–5.0)
Alkaline Phosphatase: 57 U/L (ref 38–126)
Anion gap: 8 (ref 5–15)
BILIRUBIN TOTAL: 0.9 mg/dL (ref 0.3–1.2)
BUN: 21 mg/dL — AB (ref 6–20)
CALCIUM: 9.1 mg/dL (ref 8.9–10.3)
CO2: 25 mmol/L (ref 22–32)
CREATININE: 1.39 mg/dL — AB (ref 0.61–1.24)
Chloride: 105 mmol/L (ref 101–111)
GFR, EST AFRICAN AMERICAN: 57 mL/min — AB (ref >60)
GFR, EST NON AFRICAN AMERICAN: 49 mL/min — AB (ref >60)
Glucose, Bld: 105 mg/dL — ABNORMAL HIGH (ref 65–99)
Potassium: 4.5 mmol/L (ref 3.5–5.1)
Sodium: 138 mmol/L (ref 135–145)
TOTAL PROTEIN: 7 g/dL (ref 6.5–8.1)

## 2016-03-26 LAB — CBC WITH DIFFERENTIAL/PLATELET
BASOS ABS: 0 10*3/uL (ref 0.0–0.1)
Basophils Relative: 0 %
Eosinophils Absolute: 0.1 10*3/uL (ref 0.0–0.7)
Eosinophils Relative: 2 %
HEMATOCRIT: 42.9 % (ref 39.0–52.0)
Hemoglobin: 14.4 g/dL (ref 13.0–17.0)
LYMPHS ABS: 1.5 10*3/uL (ref 0.7–4.0)
LYMPHS PCT: 25 %
MCH: 31.2 pg (ref 26.0–34.0)
MCHC: 33.6 g/dL (ref 30.0–36.0)
MCV: 92.9 fL (ref 78.0–100.0)
MONO ABS: 0.5 10*3/uL (ref 0.1–1.0)
Monocytes Relative: 8 %
NEUTROS ABS: 3.9 10*3/uL (ref 1.7–7.7)
Neutrophils Relative %: 65 %
Platelets: 292 10*3/uL (ref 150–400)
RBC: 4.62 MIL/uL (ref 4.22–5.81)
RDW: 13.2 % (ref 11.5–15.5)
WBC: 6 10*3/uL (ref 4.0–10.5)

## 2016-03-26 LAB — ABO/RH: ABO/RH(D): A NEG

## 2016-03-26 NOTE — Progress Notes (Signed)
Final EKG done 03/26/16- EPIC  

## 2016-03-26 NOTE — Progress Notes (Signed)
CMP done 03/26/16 faxed via EPIC to Dr Zella Richer.

## 2016-03-27 LAB — HEMOGLOBIN A1C
Hgb A1c MFr Bld: 5.3 % (ref 4.8–5.6)
MEAN PLASMA GLUCOSE: 105 mg/dL

## 2016-03-27 NOTE — Anesthesia Preprocedure Evaluation (Signed)
Anesthesia Evaluation  Patient identified by MRN, date of birth, ID band Patient awake    Reviewed: Allergy & Precautions, NPO status , Patient's Chart, lab work & pertinent test results  Airway Mallampati: II  TM Distance: >3 FB Neck ROM: Full    Dental no notable dental hx.    Pulmonary sleep apnea , pneumonia,    Pulmonary exam normal breath sounds clear to auscultation       Cardiovascular hypertension, negative cardio ROS Normal cardiovascular exam Rhythm:Regular Rate:Normal     Neuro/Psych negative neurological ROS  negative psych ROS   GI/Hepatic negative GI ROS, Neg liver ROS,   Endo/Other  negative endocrine ROS  Renal/GU Renal diseasenegative Renal ROS     Musculoskeletal  (+) Arthritis ,   Abdominal   Peds  Hematology negative hematology ROS (+)   Anesthesia Other Findings   Reproductive/Obstetrics negative OB ROS                             Anesthesia Physical Anesthesia Plan  ASA: II  Anesthesia Plan: General   Post-op Pain Management:    Induction: Intravenous  Airway Management Planned: Oral ETT  Additional Equipment:   Intra-op Plan:   Post-operative Plan: Extubation in OR  Informed Consent: I have reviewed the patients History and Physical, chart, labs and discussed the procedure including the risks, benefits and alternatives for the proposed anesthesia with the patient or authorized representative who has indicated his/her understanding and acceptance.   Dental advisory given  Plan Discussed with: CRNA  Anesthesia Plan Comments:         Anesthesia Quick Evaluation

## 2016-03-28 ENCOUNTER — Encounter (HOSPITAL_COMMUNITY): Payer: Self-pay | Admitting: *Deleted

## 2016-03-28 ENCOUNTER — Encounter (HOSPITAL_COMMUNITY)
Admission: RE | Disposition: A | Discharge status: Home / Self Care | Payer: Self-pay | Source: Ambulatory Visit | Attending: General Surgery

## 2016-03-28 ENCOUNTER — Inpatient Hospital Stay (HOSPITAL_COMMUNITY): Payer: Medicare Other | Admitting: Certified Registered Nurse Anesthetist

## 2016-03-28 ENCOUNTER — Inpatient Hospital Stay (HOSPITAL_COMMUNITY)
Admission: RE | Admit: 2016-03-28 | Discharge: 2016-03-31 | DRG: 331 | Disposition: A | Discharge status: Home / Self Care | Payer: Medicare Other | Source: Ambulatory Visit | Attending: General Surgery | Admitting: General Surgery

## 2016-03-28 DIAGNOSIS — K635 Polyp of colon: Secondary | ICD-10-CM | POA: Diagnosis present

## 2016-03-28 DIAGNOSIS — N183 Chronic kidney disease, stage 3 (moderate): Secondary | ICD-10-CM | POA: Diagnosis present

## 2016-03-28 DIAGNOSIS — C184 Malignant neoplasm of transverse colon: Secondary | ICD-10-CM | POA: Diagnosis not present

## 2016-03-28 DIAGNOSIS — Z86010 Personal history of colonic polyps: Secondary | ICD-10-CM

## 2016-03-28 DIAGNOSIS — Z800 Family history of malignant neoplasm of digestive organs: Secondary | ICD-10-CM

## 2016-03-28 DIAGNOSIS — M1990 Unspecified osteoarthritis, unspecified site: Secondary | ICD-10-CM | POA: Diagnosis present

## 2016-03-28 DIAGNOSIS — D123 Benign neoplasm of transverse colon: Secondary | ICD-10-CM | POA: Diagnosis present

## 2016-03-28 DIAGNOSIS — Z85820 Personal history of malignant melanoma of skin: Secondary | ICD-10-CM

## 2016-03-28 DIAGNOSIS — Z7982 Long term (current) use of aspirin: Secondary | ICD-10-CM | POA: Diagnosis not present

## 2016-03-28 DIAGNOSIS — C188 Malignant neoplasm of overlapping sites of colon: Secondary | ICD-10-CM | POA: Diagnosis not present

## 2016-03-28 DIAGNOSIS — C182 Malignant neoplasm of ascending colon: Secondary | ICD-10-CM | POA: Diagnosis not present

## 2016-03-28 DIAGNOSIS — I129 Hypertensive chronic kidney disease with stage 1 through stage 4 chronic kidney disease, or unspecified chronic kidney disease: Secondary | ICD-10-CM | POA: Diagnosis present

## 2016-03-28 DIAGNOSIS — I10 Essential (primary) hypertension: Secondary | ICD-10-CM | POA: Diagnosis not present

## 2016-03-28 DIAGNOSIS — Z79899 Other long term (current) drug therapy: Secondary | ICD-10-CM | POA: Diagnosis not present

## 2016-03-28 DIAGNOSIS — G4733 Obstructive sleep apnea (adult) (pediatric): Secondary | ICD-10-CM | POA: Diagnosis present

## 2016-03-28 HISTORY — PX: LAPAROSCOPIC PARTIAL COLECTOMY: SHX5907

## 2016-03-28 LAB — TYPE AND SCREEN
ABO/RH(D): A NEG
ANTIBODY SCREEN: NEGATIVE

## 2016-03-28 SURGERY — LAPAROSCOPIC PARTIAL COLECTOMY
Anesthesia: General

## 2016-03-28 MED ORDER — HYDROMORPHONE HCL 1 MG/ML IJ SOLN
0.2500 mg | INTRAMUSCULAR | Status: DC | PRN
  Administered 2016-03-28: 0.5 mg via INTRAVENOUS

## 2016-03-28 MED ORDER — MEPERIDINE HCL 50 MG/ML IJ SOLN: 6.2500 mg | INTRAMUSCULAR | Status: DC | PRN

## 2016-03-28 MED ORDER — ONDANSETRON HCL 4 MG/2ML IJ SOLN
INTRAMUSCULAR | Status: DC | PRN
  Administered 2016-03-28: 4 mg via INTRAVENOUS

## 2016-03-28 MED ORDER — SUCCINYLCHOLINE CHLORIDE 200 MG/10ML IV SOSY
PREFILLED_SYRINGE | INTRAVENOUS | Status: AC
  Filled 2016-03-28: qty 10

## 2016-03-28 MED ORDER — HYDROMORPHONE HCL 1 MG/ML IJ SOLN
INTRAMUSCULAR | Status: AC
  Filled 2016-03-28: qty 0.5

## 2016-03-28 MED ORDER — DIPHENHYDRAMINE HCL 50 MG/ML IJ SOLN: 12.5000 mg | Freq: Four times a day (QID) | INTRAMUSCULAR | Status: DC | PRN

## 2016-03-28 MED ORDER — FENTANYL CITRATE (PF) 100 MCG/2ML IJ SOLN
INTRAMUSCULAR | Status: AC
  Filled 2016-03-28: qty 2

## 2016-03-28 MED ORDER — ROCURONIUM BROMIDE 10 MG/ML (PF) SYRINGE
PREFILLED_SYRINGE | INTRAVENOUS | Status: DC | PRN
  Administered 2016-03-28: 10 mg via INTRAVENOUS
  Administered 2016-03-28: 50 mg via INTRAVENOUS
  Administered 2016-03-28: 10 mg via INTRAVENOUS

## 2016-03-28 MED ORDER — FLUTICASONE PROPIONATE 50 MCG/ACT NA SUSP: 2.0000 | Freq: Every day | NASAL | Status: DC | PRN

## 2016-03-28 MED ORDER — EPHEDRINE SULFATE-NACL 50-0.9 MG/10ML-% IV SOSY
PREFILLED_SYRINGE | INTRAVENOUS | Status: DC | PRN
  Administered 2016-03-28: 5 mg via INTRAVENOUS
  Administered 2016-03-28: 10 mg via INTRAVENOUS

## 2016-03-28 MED ORDER — ROCURONIUM BROMIDE 50 MG/5ML IV SOSY
PREFILLED_SYRINGE | INTRAVENOUS | Status: AC
  Filled 2016-03-28: qty 5

## 2016-03-28 MED ORDER — LIDOCAINE 2% (20 MG/ML) 5 ML SYRINGE
INTRAMUSCULAR | Status: AC
  Filled 2016-03-28: qty 5

## 2016-03-28 MED ORDER — ONDANSETRON HCL 4 MG/2ML IJ SOLN
4.0000 mg | INTRAMUSCULAR | Status: DC | PRN
Start: 2016-03-28 — End: 2016-03-31

## 2016-03-28 MED ORDER — ONDANSETRON HCL 4 MG/2ML IJ SOLN
INTRAMUSCULAR | Status: AC
  Filled 2016-03-28: qty 2

## 2016-03-28 MED ORDER — SODIUM CHLORIDE 0.9% FLUSH: 9.0000 mL | INTRAVENOUS | Status: DC | PRN

## 2016-03-28 MED ORDER — MORPHINE SULFATE 2 MG/ML IV SOLN
INTRAVENOUS | Status: DC
  Administered 2016-03-28: 12:00:00 via INTRAVENOUS
  Administered 2016-03-28: 10 mg via INTRAVENOUS
  Administered 2016-03-29: 4 mg via INTRAVENOUS
  Administered 2016-03-29: 17:00:00 via INTRAVENOUS
  Administered 2016-03-30: 2 mg via INTRAVENOUS
  Administered 2016-03-30: 0 mg via INTRAVENOUS
  Filled 2016-03-28 (×2): qty 25

## 2016-03-28 MED ORDER — DEXAMETHASONE SODIUM PHOSPHATE 10 MG/ML IJ SOLN
INTRAMUSCULAR | Status: DC | PRN
  Administered 2016-03-28: 10 mg via INTRAVENOUS

## 2016-03-28 MED ORDER — ONDANSETRON HCL 4 MG PO TABS: 4.0000 mg | ORAL_TABLET | Freq: Four times a day (QID) | ORAL | Status: DC | PRN

## 2016-03-28 MED ORDER — DIPHENHYDRAMINE HCL 12.5 MG/5ML PO ELIX: 12.5000 mg | ORAL_SOLUTION | Freq: Four times a day (QID) | ORAL | Status: DC | PRN

## 2016-03-28 MED ORDER — MIDAZOLAM HCL 5 MG/5ML IJ SOLN
INTRAMUSCULAR | Status: DC | PRN
  Administered 2016-03-28: 2 mg via INTRAVENOUS

## 2016-03-28 MED ORDER — KCL IN DEXTROSE-NACL 20-5-0.9 MEQ/L-%-% IV SOLN
INTRAVENOUS | Status: DC
  Administered 2016-03-28 – 2016-03-31 (×6): via INTRAVENOUS
  Filled 2016-03-28 (×8): qty 1000

## 2016-03-28 MED ORDER — PROPOFOL 10 MG/ML IV BOLUS
INTRAVENOUS | Status: DC | PRN
  Administered 2016-03-28: 170 mg via INTRAVENOUS

## 2016-03-28 MED ORDER — FENTANYL CITRATE (PF) 100 MCG/2ML IJ SOLN
INTRAMUSCULAR | Status: DC | PRN
  Administered 2016-03-28 (×4): 50 ug via INTRAVENOUS

## 2016-03-28 MED ORDER — ALVIMOPAN 12 MG PO CAPS
12.0000 mg | ORAL_CAPSULE | Freq: Two times a day (BID) | ORAL | Status: DC
  Administered 2016-03-29: 12 mg via ORAL
  Filled 2016-03-28: qty 1

## 2016-03-28 MED ORDER — PROPOFOL 10 MG/ML IV BOLUS
INTRAVENOUS | Status: AC
  Filled 2016-03-28: qty 20

## 2016-03-28 MED ORDER — CHLORHEXIDINE GLUCONATE CLOTH 2 % EX PADS: 6.0000 | MEDICATED_PAD | Freq: Once | CUTANEOUS | Status: DC

## 2016-03-28 MED ORDER — DEXAMETHASONE SODIUM PHOSPHATE 10 MG/ML IJ SOLN
INTRAMUSCULAR | Status: AC
  Filled 2016-03-28: qty 1

## 2016-03-28 MED ORDER — BUPIVACAINE HCL (PF) 0.5 % IJ SOLN
INTRAMUSCULAR | Status: DC | PRN
  Administered 2016-03-28: 20 mL

## 2016-03-28 MED ORDER — AMLODIPINE BESYLATE 10 MG PO TABS
10.0000 mg | ORAL_TABLET | Freq: Every day | ORAL | Status: DC
  Administered 2016-03-29 – 2016-03-31 (×3): 10 mg via ORAL
  Filled 2016-03-28 (×4): qty 1

## 2016-03-28 MED ORDER — HEPARIN SODIUM (PORCINE) 5000 UNIT/ML IJ SOLN
5000.0000 [IU] | Freq: Three times a day (TID) | INTRAMUSCULAR | Status: DC
  Administered 2016-03-29 – 2016-03-31 (×7): 5000 [IU] via SUBCUTANEOUS
  Filled 2016-03-28 (×6): qty 1

## 2016-03-28 MED ORDER — BUPIVACAINE HCL (PF) 0.5 % IJ SOLN
INTRAMUSCULAR | Status: AC
  Filled 2016-03-28: qty 30

## 2016-03-28 MED ORDER — ALVIMOPAN 12 MG PO CAPS
12.0000 mg | ORAL_CAPSULE | Freq: Once | ORAL | Status: AC
  Administered 2016-03-28: 12 mg via ORAL
  Filled 2016-03-28: qty 1

## 2016-03-28 MED ORDER — 0.9 % SODIUM CHLORIDE (POUR BTL) OPTIME
TOPICAL | Status: DC | PRN
  Administered 2016-03-28: 2500 mL

## 2016-03-28 MED ORDER — LACTATED RINGERS IV SOLN: INTRAVENOUS | Status: DC

## 2016-03-28 MED ORDER — DEXTROSE 5 % IV SOLN
2.0000 g | INTRAVENOUS | Status: AC
  Administered 2016-03-28: 2 g via INTRAVENOUS
  Filled 2016-03-28: qty 2

## 2016-03-28 MED ORDER — LACTATED RINGERS IV SOLN
INTRAVENOUS | Status: DC | PRN
  Administered 2016-03-28 (×2): via INTRAVENOUS

## 2016-03-28 MED ORDER — LIDOCAINE 2% (20 MG/ML) 5 ML SYRINGE
INTRAMUSCULAR | Status: DC | PRN
  Administered 2016-03-28: 100 mg via INTRAVENOUS

## 2016-03-28 MED ORDER — DEXTROSE 5 % IV SOLN
2.0000 g | Freq: Two times a day (BID) | INTRAVENOUS | Status: AC
  Administered 2016-03-28: 2 g via INTRAVENOUS
  Filled 2016-03-28: qty 2

## 2016-03-28 MED ORDER — PANTOPRAZOLE SODIUM 40 MG IV SOLR
40.0000 mg | Freq: Every day | INTRAVENOUS | Status: DC
  Administered 2016-03-28 – 2016-03-31 (×4): 40 mg via INTRAVENOUS
  Filled 2016-03-28 (×4): qty 40

## 2016-03-28 MED ORDER — CEFOTETAN DISODIUM-DEXTROSE 2-2.08 GM-% IV SOLR
INTRAVENOUS | Status: AC
  Filled 2016-03-28: qty 50

## 2016-03-28 MED ORDER — LACTATED RINGERS IR SOLN
Status: DC | PRN
  Administered 2016-03-28: 3000 mL

## 2016-03-28 MED ORDER — PROMETHAZINE HCL 25 MG/ML IJ SOLN: 6.2500 mg | INTRAMUSCULAR | Status: DC | PRN

## 2016-03-28 MED ORDER — MIDAZOLAM HCL 2 MG/2ML IJ SOLN
INTRAMUSCULAR | Status: AC
  Filled 2016-03-28: qty 2

## 2016-03-28 MED ORDER — LOSARTAN POTASSIUM 50 MG PO TABS
100.0000 mg | ORAL_TABLET | Freq: Every day | ORAL | Status: DC
Start: 2016-03-28 — End: 2016-03-31
  Administered 2016-03-28 – 2016-03-31 (×4): 100 mg via ORAL
  Filled 2016-03-28 (×4): qty 2

## 2016-03-28 MED ORDER — EPHEDRINE 5 MG/ML INJ
INTRAVENOUS | Status: AC
  Filled 2016-03-28: qty 10

## 2016-03-28 MED ORDER — ONDANSETRON HCL 4 MG/2ML IJ SOLN: 4.0000 mg | Freq: Four times a day (QID) | INTRAMUSCULAR | Status: DC | PRN

## 2016-03-28 MED ORDER — NALOXONE HCL 0.4 MG/ML IJ SOLN: 0.4000 mg | INTRAMUSCULAR | Status: DC | PRN

## 2016-03-28 MED ORDER — CITALOPRAM HYDROBROMIDE 20 MG PO TABS
20.0000 mg | ORAL_TABLET | Freq: Every day | ORAL | Status: DC
Start: 2016-03-28 — End: 2016-03-31
  Administered 2016-03-28 – 2016-03-31 (×4): 20 mg via ORAL
  Filled 2016-03-28 (×4): qty 1

## 2016-03-28 SURGICAL SUPPLY — 75 items
APL SKNCLS STERI-STRIP NONHPOA (GAUZE/BANDAGES/DRESSINGS) ×2
APPLIER CLIP 5 13 M/L LIGAMAX5 (MISCELLANEOUS)
APPLIER CLIP ROT 10 11.4 M/L (STAPLE)
APR CLP MED LRG 11.4X10 (STAPLE)
APR CLP MED LRG 5 ANG JAW (MISCELLANEOUS)
BENZOIN TINCTURE PRP APPL 2/3 (GAUZE/BANDAGES/DRESSINGS) ×6 IMPLANT
BLADE EXTENDED COATED 6.5IN (ELECTRODE) IMPLANT
BLADE HEX COATED 2.75 (ELECTRODE) ×3 IMPLANT
CABLE HIGH FREQUENCY MONO STRZ (ELECTRODE) ×3 IMPLANT
CELLS DAT CNTRL 66122 CELL SVR (MISCELLANEOUS) IMPLANT
CLIP APPLIE 5 13 M/L LIGAMAX5 (MISCELLANEOUS) IMPLANT
CLIP APPLIE ROT 10 11.4 M/L (STAPLE) IMPLANT
CLOSURE WOUND 1/2 X4 (GAUZE/BANDAGES/DRESSINGS) ×2
COUNTER NEEDLE 20 DBL MAG RED (NEEDLE) ×3 IMPLANT
COVER MAYO STAND STRL (DRAPES) ×9 IMPLANT
COVER SURGICAL LIGHT HANDLE (MISCELLANEOUS) IMPLANT
DECANTER SPIKE VIAL GLASS SM (MISCELLANEOUS) IMPLANT
DISSECTOR BLUNT TIP ENDO 5MM (MISCELLANEOUS) ×3 IMPLANT
DRAIN CHANNEL 19F RND (DRAIN) IMPLANT
DRAPE LAPAROSCOPIC ABDOMINAL (DRAPES) ×3 IMPLANT
DRAPE SURG IRRIG POUCH 19X23 (DRAPES) ×3 IMPLANT
DRSG OPSITE POSTOP 4X10 (GAUZE/BANDAGES/DRESSINGS) IMPLANT
DRSG OPSITE POSTOP 4X6 (GAUZE/BANDAGES/DRESSINGS) ×3 IMPLANT
DRSG OPSITE POSTOP 4X8 (GAUZE/BANDAGES/DRESSINGS) IMPLANT
DRSG TEGADERM 2-3/8X2-3/4 SM (GAUZE/BANDAGES/DRESSINGS) ×15 IMPLANT
ELECT REM PT RETURN 15FT ADLT (MISCELLANEOUS) IMPLANT
EVACUATOR SILICONE 100CC (DRAIN) IMPLANT
FILTER SMOKE EVAC LAPAROSHD (FILTER) ×3 IMPLANT
GAUZE SPONGE 4X4 12PLY STRL (GAUZE/BANDAGES/DRESSINGS) ×3 IMPLANT
GLOVE ECLIPSE 8.0 STRL XLNG CF (GLOVE) ×6 IMPLANT
GLOVE INDICATOR 8.0 STRL GRN (GLOVE) ×6 IMPLANT
GOWN STRL REUS W/TWL XL LVL3 (GOWN DISPOSABLE) ×12 IMPLANT
HOLDER FOLEY CATH W/STRAP (MISCELLANEOUS) ×3 IMPLANT
IRRIG SUCT STRYKERFLOW 2 WTIP (MISCELLANEOUS) ×3
IRRIGATION SUCT STRKRFLW 2 WTP (MISCELLANEOUS) ×1 IMPLANT
LEGGING LITHOTOMY PAIR STRL (DRAPES) ×3 IMPLANT
LIGASURE IMPACT 36 18CM CVD LR (INSTRUMENTS) IMPLANT
PACK COLON (CUSTOM PROCEDURE TRAY) ×3 IMPLANT
PAD POSITIONING PINK XL (MISCELLANEOUS) ×3 IMPLANT
PORT LAP GEL ALEXIS MED 5-9CM (MISCELLANEOUS) IMPLANT
RELOAD PROXIMATE 75MM BLUE (ENDOMECHANICALS) ×6 IMPLANT
RTRCTR WOUND ALEXIS 18CM MED (MISCELLANEOUS)
SCISSORS LAP 5X35 DISP (ENDOMECHANICALS) ×3 IMPLANT
SEALER TISSUE X1 CVD JAW (INSTRUMENTS) ×3 IMPLANT
SHEARS HARMONIC ACE PLUS 36CM (ENDOMECHANICALS) ×3 IMPLANT
SLEEVE XCEL OPT CAN 5 100 (ENDOMECHANICALS) ×12 IMPLANT
SPONGE DRAIN TRACH 4X4 STRL 2S (GAUZE/BANDAGES/DRESSINGS) IMPLANT
STAPLER 90 3.5 STAND SLIM (STAPLE) ×3
STAPLER 90 3.5 STD SLIM (STAPLE) ×1 IMPLANT
STAPLER PROXIMATE 75MM BLUE (STAPLE) ×3 IMPLANT
STAPLER VISISTAT 35W (STAPLE) ×3 IMPLANT
STRIP CLOSURE SKIN 1/2X4 (GAUZE/BANDAGES/DRESSINGS) ×4 IMPLANT
SUT ETHILON 3 0 PS 1 (SUTURE) IMPLANT
SUT MNCRL AB 4-0 PS2 18 (SUTURE) ×6 IMPLANT
SUT PDS AB 1 CTX 36 (SUTURE) ×6 IMPLANT
SUT PDS AB 1 TP1 96 (SUTURE) IMPLANT
SUT PROLENE 2 0 SH DA (SUTURE) ×3 IMPLANT
SUT SILK 2 0 (SUTURE) ×3
SUT SILK 2 0 SH CR/8 (SUTURE) ×3 IMPLANT
SUT SILK 2-0 18XBRD TIE 12 (SUTURE) ×1 IMPLANT
SUT SILK 3 0 (SUTURE)
SUT SILK 3 0 SH CR/8 (SUTURE) ×6 IMPLANT
SUT SILK 3-0 18XBRD TIE 12 (SUTURE) IMPLANT
SUT VICRYL 2 0 18  UND BR (SUTURE)
SUT VICRYL 2 0 18 UND BR (SUTURE) IMPLANT
SYS LAPSCP GELPORT 120MM (MISCELLANEOUS) ×3
SYSTEM LAPSCP GELPORT 120MM (MISCELLANEOUS) ×1 IMPLANT
TOWEL OR 17X26 10 PK STRL BLUE (TOWEL DISPOSABLE) ×3 IMPLANT
TOWEL OR NON WOVEN STRL DISP B (DISPOSABLE) ×3 IMPLANT
TRAY FOLEY BAG SILVER LF 14FR (CATHETERS) IMPLANT
TRAY FOLEY W/METER SILVER 16FR (SET/KITS/TRAYS/PACK) ×3 IMPLANT
TROCAR BLADELESS OPT 5 100 (ENDOMECHANICALS) ×3 IMPLANT
TROCAR XCEL BLUNT TIP 100MML (ENDOMECHANICALS) IMPLANT
TROCAR XCEL NON-BLD 11X100MML (ENDOMECHANICALS) IMPLANT
TUBING INSUF HEATED (TUBING) ×3 IMPLANT

## 2016-03-28 NOTE — Interval H&P Note (Signed)
History and Physical Interval Note:  03/28/2016 7:28 AM  Adam Daniel  has presented today for surgery, with the diagnosis of DYSPLASTIC COLONIC NEOPLASM  The various methods of treatment have been discussed with the patient and family. After consideration of risks, benefits and other options for treatment, the patient has consented to  Procedure(s): LAPAROSCOPIC ASSISTED  PARTIAL COLECTOMY (N/A) as a surgical intervention .  The patient's history has been reviewed, patient examined, no change in status, stable for surgery.  I have reviewed the patient's chart and labs.  Questions were answered to the patient's satisfaction.     Aki Abalos Lenna Sciara

## 2016-03-28 NOTE — Progress Notes (Signed)
Assisted pt. With placing on his home cpap. Placed sterile water into pt. Chamber. Pt. Also connected to 2L of oxygen which is titrated into the pt. Cpap. Pt. Tolerating well at this time.

## 2016-03-28 NOTE — Anesthesia Postprocedure Evaluation (Signed)
Anesthesia Post Note  Patient: Adam Daniel  Procedure(s) Performed: Procedure(s) (LRB): LAPAROSCOPIC ASSISTED  EXTENDED RIGHT COLECTOMY (N/A)  Patient location during evaluation: PACU Anesthesia Type: General Level of consciousness: sedated and patient cooperative Pain management: pain level controlled Vital Signs Assessment: post-procedure vital signs reviewed and stable Respiratory status: spontaneous breathing Cardiovascular status: stable Anesthetic complications: no    Last Vitals:  Vitals:   03/28/16 1226 03/28/16 1229  BP: (!) 110/57   Pulse: 62   Resp: 17 17  Temp: 36.4 C     Last Pain:  Vitals:   03/28/16 1229  TempSrc:   PainSc: [place]

## 2016-03-28 NOTE — Transfer of Care (Signed)
Immediate Anesthesia Transfer of Care Note  Patient: Adam Daniel  Procedure(s) Performed: Procedure(s): LAPAROSCOPIC ASSISTED  EXTENDED RIGHT COLECTOMY (N/A)  Patient Location: PACU  Anesthesia Type:General  Level of Consciousness: awake, alert  and oriented  Airway & Oxygen Therapy: Patient Spontanous Breathing and Patient connected to face mask oxygen  Post-op Assessment: Report given to RN and Post -op Vital signs reviewed and stable  Post vital signs: Reviewed and stable  Last Vitals:  Vitals:   03/28/16 0536  BP: (!) 144/81  Pulse: 65  Resp: 16  Temp: 36.7 C    Last Pain:  Vitals:   03/28/16 0536  TempSrc: Oral      Patients Stated Pain Goal: 4 ([id number])  Complications: No apparent anesthesia complications

## 2016-03-28 NOTE — Op Note (Signed)
Operative Note  Adam Daniel male 70 y.o. 03/28/2016  PREOPERATIVE DX:  Highly dysplastic polyp transverse colon  POSTOPERATIVE DX:  Highly dysplastic polyp hepatic flexure of colon  PROCEDURE:   Laparoscopic-assisted extended right colectomy         Surgeon: Odis Hollingshead   Assistants: Verita Lamb, M.D.  Anesthesia: General endotracheal anesthesia  Indications:   This is a 70 year old male with history of colon polyps. He was undergoing his five-year surveillance colonoscopy. There was a 3 cm tubular adenoma with high-grade dysplasia noted in the transverse colon on colonoscopy. There are 3 smaller polyps in this area.  The 3 cm dysplastic polyp could not be completely removed colonoscopically. He now presents for elective resection.    Procedure Detail:  He was brought to the operating room placed supine on the operating table and general anesthetic was given. He was placed in the lithotomy position. A Foley catheter was inserted. An oral gastric tube was inserted. The abdominal wall and perineal areas were widely sterilely prepped and draped. A timeout was performed.  He was placed in slight reverse Trendelenburg position. A 5 mm incision was made in the right upper quadrant. Using a 5 mm Optiview trocar and laparoscope, access was gained into the peritoneal cavity. A pneumoperitoneum was created. Inspection of the area under the trocar demonstrated no evidence of organ injury or bleeding.  A 5 mm trocar was placed in the subumbilical area. A 5 mm trocar was placed in the left lower quadrant. In the left upper quadrant I examined the transverse colon and splenic flexure. I then examined the rest of the transverse colon to the right upper quadrant. At the hepatic flexure there was an ink mark. A 5 mm trocar was placed in the right lower quadrant. A 5 mm trochars placed in the left upper quadrant. The distal ileum and right colon were mobilized by dividing their attachments to  the lateral abdominal wall. The proximal half of the transverse colon was mobilized by freeing up its attachments to the omentum. The hepatic flexure was mobilized similarly. A limited upper midline incision extraction site was made and wound protection device placed in it. The terminal ileum, right colon, and proximal half of the transverse colon within exteriorized. The mass was palpable at the hepatic flexure. The transverse colon was divided with the linear cutting stapler close to its midpoint. The distal ileum was divided with the linear cutting stapler 5 cm proximal to the ileocecal valve. Mesentery was resected using the Endoseal.  The extended right colon specimen was then handed off the field and sent to pathology.  Mesentery where the resection was performed was hemostatic. A side to side anastomosis between the distal ileum and transverse colon was performed with the linear cutting stapler. Staple lines were hemostatic. A crotch stitch of 3-0 silk was placed. The common defect was closed with a linear noncutting stapler. This was imbricated using interrupted 3-0 silk sutures and a Lembert fashion. The anastomosis was patent, viable, and under no tension.  The abdominal cavity was irrigated with a liter of irrigation fluid. Gloves, gowns, and instruments were then changed.  The abdominal cavity was then irrigated with 2 L of fluid. There is no evidence of bleeding or organ injury.  The midline fascia of the extraction incision was then closed with running #1 PDS suture. Repeat laparoscopy was performed. Residual irrigation fluid was evacuated as much as possible. A 4 quadrant and central inspection were performed. There is no evidence of  bleeding or organ injury. The pneumoperitoneum was released. The trocars were removed.  Needle, sponge and instrument counts had been reported to be correct before closure.  Trocar site skin incisions were closed with 4-0 Monocryl subcuticular stitches followed by  Steri-Strips and sterile dressings. The extraction site skin incision was closed with staples followed by sterile dressing.  He tolerated the procedure well without any apparent complications and was taken the recovery room in satisfactory condition.   Estimated Blood Loss:  200 mL         Drains: none         Specimens: Extended right colon        Complications:  * No complications entered in OR log *         Disposition: PACU - hemodynamically stable.         Condition: stable

## 2016-03-28 NOTE — Anesthesia Procedure Notes (Signed)
Procedure Name: Intubation Date/Time: 03/28/2016 7:47 AM Performed by: Noralyn Pick D Pre-anesthesia Checklist: Patient identified, Emergency Drugs available, Suction available and Patient being monitored Patient Re-evaluated:Patient Re-evaluated prior to inductionOxygen Delivery Method: Circle system utilized Preoxygenation: Pre-oxygenation with 100% oxygen Intubation Type: IV induction Ventilation: Mask ventilation without difficulty Laryngoscope Size: Mac and 4 Grade View: Grade II Tube type: Oral Tube size: 7.5 mm Number of attempts: 1 Airway Equipment and Method: Stylet Placement Confirmation: ETT inserted through vocal cords under direct vision,  positive ETCO2 and breath sounds checked- equal and bilateral Secured at: 22 cm Tube secured with: Tape Dental Injury: Teeth and Oropharynx as per pre-operative assessment

## 2016-03-28 NOTE — H&P (Signed)
Adam Daniel is an 70 y.o. male.   Chief Complaint:   Here for elective surgery. HPI:  He was referred by Dr. Teena Irani for consultation regarding a 3 cm tubular adenoma with high-grade dysplasia in the transverse colon. He has a history of colon polyps and gets a colonoscopy every 5 years. Dr. Amedeo Plenty performed one this year and he found a mass in the transverse colon that was not obstructing. This was partially circumferential. It measured 3 cm in length. Biopsy was performed. The mass could not be completely removed given its nature. Final pathology is as above. The only family history of colon cancer was in his grandfather. Other polyps were found but these were benign. The area of the transverse colon lesion was marked with [place] ink.  Past Medical History:  Diagnosis Date  . Arthritis    right knee   . Cancer ([place])    melanoma on right shoulder   . Chronic kidney disease    stage III  . Hypertension   . Pneumonia    hx of years ago   . Psoriasis   . Sleep apnea    cpap - heated machine 9 setting     Past Surgical History:  Procedure Laterality Date  . COLONOSCOPY    . melanoma removed from right shoulder       History reviewed. No pertinent family history. Social History:  reports that he has never smoked. He has never used smokeless tobacco. He reports that he drinks alcohol. He reports that he does not use drugs.  Allergies: No Known Allergies  Medications Prior to Admission  Medication Sig Dispense Refill  . amLODipine (NORVASC) 10 MG tablet Take 10 mg by mouth daily.  11  . citalopram (CELEXA) 20 MG tablet Take 20 mg by mouth daily.  5  . [employer] GENTLE LAXATIVE 5 MG EC tablet Take 4 tablets by mouth once.    . fluticasone (FLONASE) 50 MCG/ACT nasal spray Place 2 sprays into both nostrils daily as needed for allergies.  3  . loratadine (CLARITIN) 10 MG tablet Take 10 mg by mouth daily.    Marland Kitchen losartan (COZAAR) 100 MG tablet Take 100 mg by mouth daily.  11  .  metroNIDAZOLE (FLAGYL) 500 MG tablet Take 1,000 mg by mouth as directed. 1000 mg three times daily before procedure    . neomycin (MYCIFRADIN) 500 MG tablet Take 1,000 mg by mouth as directed. 1000 mg three times daily before procedure    . polyethylene glycol powder (GLYCOLAX/MIRALAX) powder Take 1 Container by mouth once. Mix with gatorade and take pre-procedure    . rosuvastatin (CRESTOR) 5 MG tablet Take 5 mg by mouth at bedtime.  11  . aspirin EC 81 MG tablet Take 81 mg by mouth daily.      Results for orders placed or performed during the hospital encounter of 03/26/16 (from the past 48 hour(s))  Type and screen All Cardiac and thoracic surgeries, spinal fusions, myomectomies, craniotomies, colon & liver resections, total joint revisions, same day c-section with placenta previa or accreta.     Status: None   Collection Time: 03/26/16 11:27 AM  Result Value Ref Range   ABO/RH(D) A NEG    Antibody Screen NEG    Sample Expiration 03/31/2016    Extend sample reason NO TRANSFUSIONS OR PREGNANCY IN THE PAST 3 MONTHS   ABO/Rh     Status: None   Collection Time: 03/26/16 11:27 AM  Result Value Ref Range  ABO/RH(D) A NEG   CBC WITH DIFFERENTIAL     Status: None   Collection Time: 03/26/16 11:31 AM  Result Value Ref Range   WBC 6.0 4.0 - 10.5 K/uL   RBC 4.62 4.22 - 5.81 MIL/uL   Hemoglobin 14.4 13.0 - 17.0 g/dL   HCT 42.9 39.0 - 52.0 %   MCV 92.9 78.0 - 100.0 fL   MCH 31.2 26.0 - 34.0 pg   MCHC 33.6 30.0 - 36.0 g/dL   RDW 13.2 11.5 - 15.5 %   Platelets 292 150 - 400 K/uL   Neutrophils Relative % 65 %   Neutro Abs 3.9 1.7 - 7.7 K/uL   Lymphocytes Relative 25 %   Lymphs Abs 1.5 0.7 - 4.0 K/uL   Monocytes Relative 8 %   Monocytes Absolute 0.5 0.1 - 1.0 K/uL   Eosinophils Relative 2 %   Eosinophils Absolute 0.1 0.0 - 0.7 K/uL   Basophils Relative 0 %   Basophils Absolute 0.0 0.0 - 0.1 K/uL  Comprehensive metabolic panel     Status: Abnormal   Collection Time: 03/26/16 11:31 AM   Result Value Ref Range   Sodium 138 135 - 145 mmol/L   Potassium 4.5 3.5 - 5.1 mmol/L   Chloride 105 101 - 111 mmol/L   CO2 25 22 - 32 mmol/L   Glucose, Bld 105 (H) 65 - 99 mg/dL   BUN 21 (H) 6 - 20 mg/dL   Creatinine, Ser 1.39 (H) 0.61 - 1.24 mg/dL   Calcium 9.1 8.9 - 10.3 mg/dL   Total Protein 7.0 6.5 - 8.1 g/dL   Albumin 4.4 3.5 - 5.0 g/dL   AST 22 15 - 41 U/L   ALT 23 17 - 63 U/L   Alkaline Phosphatase 57 38 - 126 U/L   Total Bilirubin 0.9 0.3 - 1.2 mg/dL   GFR calc non Af Amer 49 (L) >60 mL/min   GFR calc Af Amer 57 (L) >60 mL/min    Comment: (NOTE) The eGFR has been calculated using the CKD EPI equation. This calculation has not been validated in all clinical situations. eGFR's persistently <60 mL/min signify possible Chronic Kidney Disease.    Anion gap 8 5 - 15  Hemoglobin A1c     Status: None   Collection Time: 03/26/16 11:31 AM  Result Value Ref Range   Hgb A1c MFr Bld 5.3 4.8 - 5.6 %    Comment: (NOTE)         Pre-diabetes: 5.7 - 6.4         Diabetes: >6.4         Glycemic control for adults with diabetes: <7.0    Mean Plasma Glucose 105 mg/dL    Comment: (NOTE) Performed At: [facility] [place], [place] [id number] Lindon Romp MD GY:[id number]    No results found.  Review of Systems  Constitutional: Negative for chills and fever.  HENT: Negative for congestion and sore throat.   Respiratory: Cough: mild.   Gastrointestinal: Negative for diarrhea, nausea and vomiting.    Blood pressure (!) 144/81, pulse 65, temperature 98 F (36.7 C), temperature source Oral, resp. rate 16, height _0  (1.753 m), weight 82.6 kg (182 lb), SpO2 97 %. Physical Exam  Constitutional: He appears well-developed and well-nourished. No distress.  HENT:  Head: Normocephalic and atraumatic.  Eyes: Right eye exhibits no discharge. Left eye exhibits no discharge. No scleral icterus.  Cardiovascular: Normal rate and regular rhythm.   Respiratory:  Effort normal and breath sounds normal.  GI: Soft. He exhibits no mass. There is no tenderness.  Neurological: He is alert.  Skin: Skin is warm and dry.  Psychiatric: He has a normal mood and affect. His behavior is normal.     Assessment/Plan Highly dysplastic colon polyp in transverse colon that could not be removed via colonoscopy  Plan:  Laparoscopic assisted partial colectomy.  Odis Hollingshead, MD 03/28/2016, 7:23 AM

## 2016-03-29 LAB — BASIC METABOLIC PANEL
Anion gap: 6 (ref 5–15)
BUN: 16 mg/dL (ref 6–20)
CALCIUM: 8 mg/dL — AB (ref 8.9–10.3)
CO2: 23 mmol/L (ref 22–32)
CREATININE: 1.34 mg/dL — AB (ref 0.61–1.24)
Chloride: 112 mmol/L — ABNORMAL HIGH (ref 101–111)
GFR calc non Af Amer: 51 mL/min — ABNORMAL LOW (ref >60)
GFR, EST AFRICAN AMERICAN: 59 mL/min — AB (ref >60)
Glucose, Bld: 161 mg/dL — ABNORMAL HIGH (ref 65–99)
Potassium: 4.5 mmol/L (ref 3.5–5.1)
SODIUM: 141 mmol/L (ref 135–145)

## 2016-03-29 LAB — CBC
HCT: 36.6 % — ABNORMAL LOW (ref 39.0–52.0)
Hemoglobin: 12.2 g/dL — ABNORMAL LOW (ref 13.0–17.0)
MCH: 31.1 pg (ref 26.0–34.0)
MCHC: 33.3 g/dL (ref 30.0–36.0)
MCV: 93.4 fL (ref 78.0–100.0)
PLATELETS: 229 10*3/uL (ref 150–400)
RBC: 3.92 MIL/uL — AB (ref 4.22–5.81)
RDW: 13.3 % (ref 11.5–15.5)
WBC: 10.9 10*3/uL — AB (ref 4.0–10.5)

## 2016-03-29 NOTE — Progress Notes (Signed)
Assessment Active Problems:   Dysplastic polyp of the hepatic flexure of the colon s/p laparoscopic assisted extended right colectomy 12/717-stable overnight course    OSA-on CPAP at night    HTN-  BP wnl; home meds restarted   Plan:  Clear liquid diet. Ambulate.   LOS: 1 day     1 Day Post-Op  Subjective: Pain well-controlled.  Passed a little gas.  Has walked.  Wife in room.  Objective: Vital signs in last 24 hours: Temp:  [97.5 F (36.4 C)-98.9 F (37.2 C)] 98.7 F (37.1 C) (12/08 0540) Pulse Rate:  [51-77] 54 (12/08 0635) Resp:  [11-20] 16 (12/08 0635) BP: (103-118)/(57-71) 109/57 (12/08 0540) SpO2:  [92 %-100 %] 97 % (12/08 0635) Last BM Date: 03/27/16  Intake/Output from previous day: 12/07 0701 - 12/08 0700 In: 4033.3 [I.V.:3983.3; IV Piggyback:50] Out: [id number]; Blood:200] Intake/Output this shift: No intake/output data recorded.  PE: General- In NAD. Awake and alert. Abdomen-soft, some bruising around incisions, active bowel sounds  Lab Results:   Recent Labs  03/26/16 1131 03/29/16 0430  WBC 6.0 10.9*  HGB 14.4 12.2*  HCT 42.9 36.6*  PLT 292 229   BMET  Recent Labs  03/26/16 1131 03/29/16 0430  NA 138 141  K 4.5 4.5  CL 105 112*  CO2 25 23  GLUCOSE 105* 161*  BUN 21* 16  CREATININE 1.39* 1.34*  CALCIUM 9.1 8.0*   PT/INR No results for input(s): LABPROT, INR in the last 72 hours. Comprehensive Metabolic Panel:    Component Value Date/Time   NA 141 03/29/2016 0430   NA 138 03/26/2016 1131   K 4.5 03/29/2016 0430   K 4.5 03/26/2016 1131   CL 112 (H) 03/29/2016 0430   CL 105 03/26/2016 1131   CO2 23 03/29/2016 0430   CO2 25 03/26/2016 1131   BUN 16 03/29/2016 0430   BUN 21 (H) 03/26/2016 1131   CREATININE 1.34 (H) 03/29/2016 0430   CREATININE 1.39 (H) 03/26/2016 1131   GLUCOSE 161 (H) 03/29/2016 0430   GLUCOSE 105 (H) 03/26/2016 1131   CALCIUM 8.0 (L) 03/29/2016 0430   CALCIUM 9.1 03/26/2016 1131   AST 22 03/26/2016  1131   ALT 23 03/26/2016 1131   ALKPHOS 57 03/26/2016 1131   BILITOT 0.9 03/26/2016 1131   PROT 7.0 03/26/2016 1131   ALBUMIN 4.4 03/26/2016 1131     Studies/Results: No results found.  Anti-infectives: Anti-infectives    Start     Dose/Rate Route Frequency Ordered Stop   03/28/16 1800  cefoTEtan (CEFOTAN) 2 g in dextrose 5 % 50 mL IVPB     2 g 100 mL/hr over 30 Minutes Intravenous Every 12 hours 03/28/16 1142 03/28/16 1849   03/28/16 0555  cefoTEtan (CEFOTAN) 2 g in dextrose 5 % 50 mL IVPB     2 g 100 mL/hr over 30 Minutes Intravenous On call to O.R. 03/28/16 0555 03/28/16 0749       Adam Daniel 03/29/2016

## 2016-03-30 MED ORDER — MORPHINE SULFATE (PF) 2 MG/ML IV SOLN: 2.0000 mg | INTRAVENOUS | Status: DC | PRN

## 2016-03-30 MED ORDER — HYDROCODONE-ACETAMINOPHEN 5-325 MG PO TABS: 1.0000 | ORAL_TABLET | ORAL | Status: DC | PRN

## 2016-03-30 NOTE — Progress Notes (Signed)
Assessment Active Problems:   Dysplastic polyp of the hepatic flexure of the colon s/p laparoscopic assisted extended right colectomy 12/717-bowel function returning.    OSA-on CPAP at night    HTN-  BP wnl; home meds restarted   Plan:  Soft diet.  D/C PCA.  Oral analgesic with IV back up.   LOS: 2 days     2 Days Post-Op  Subjective: Feels good.  Passed gas.  Had a BM.  Tolerating clear liquids.  Family in room.  Objective: Vital signs in last 24 hours: Temp:  [97.7 F (36.5 C)-98.9 F (37.2 C)] 97.7 F (36.5 C) (12/09 0600) Pulse Rate:  [50-63] 54 (12/09 0600) Resp:  [16-19] 18 (12/09 0600) BP: (102-136)/(53-70) 136/69 (12/09 0600) SpO2:  [93 %-99 %] 95 % (12/09 0600) Last BM Date: 03/29/16  Intake/Output from previous day: 12/08 0701 - 12/09 0700 In: 2612.1 [P.O.:1905; I.V.:707.1] Out: 1950 [Urine:1950] Intake/Output this shift: No intake/output data recorded.  PE: General- In NAD. Awake and alert. Abdomen-soft, some bruising around incisions, active bowel sounds  Lab Results:   Recent Labs  03/29/16 0430  WBC 10.9*  HGB 12.2*  HCT 36.6*  PLT 229   BMET  Recent Labs  03/29/16 0430  NA 141  K 4.5  CL 112*  CO2 23  GLUCOSE 161*  BUN 16  CREATININE 1.34*  CALCIUM 8.0*   PT/INR No results for input(s): LABPROT, INR in the last 72 hours. Comprehensive Metabolic Panel:    Component Value Date/Time   NA 141 03/29/2016 0430   NA 138 03/26/2016 1131   K 4.5 03/29/2016 0430   K 4.5 03/26/2016 1131   CL 112 (H) 03/29/2016 0430   CL 105 03/26/2016 1131   CO2 23 03/29/2016 0430   CO2 25 03/26/2016 1131   BUN 16 03/29/2016 0430   BUN 21 (H) 03/26/2016 1131   CREATININE 1.34 (H) 03/29/2016 0430   CREATININE 1.39 (H) 03/26/2016 1131   GLUCOSE 161 (H) 03/29/2016 0430   GLUCOSE 105 (H) 03/26/2016 1131   CALCIUM 8.0 (L) 03/29/2016 0430   CALCIUM 9.1 03/26/2016 1131   AST 22 03/26/2016 1131   ALT 23 03/26/2016 1131   ALKPHOS 57 03/26/2016 1131   BILITOT 0.9 03/26/2016 1131   PROT 7.0 03/26/2016 1131   ALBUMIN 4.4 03/26/2016 1131     Studies/Results: No results found.  Anti-infectives: Anti-infectives    Start     Dose/Rate Route Frequency Ordered Stop   03/28/16 1800  cefoTEtan (CEFOTAN) 2 g in dextrose 5 % 50 mL IVPB     2 g 100 mL/hr over 30 Minutes Intravenous Every 12 hours 03/28/16 1142 03/28/16 1849   03/28/16 0555  cefoTEtan (CEFOTAN) 2 g in dextrose 5 % 50 mL IVPB     2 g 100 mL/hr over 30 Minutes Intravenous On call to O.R. 03/28/16 0555 03/28/16 0749       Adam Daniel J 03/30/2016

## 2016-03-30 NOTE — Progress Notes (Signed)
Pt stated that he will self administer home CPAP when ready for bed.  Pt to notify RT if any assistance is required throughout the night.  RT to monitor and assess as needed.

## 2016-03-31 MED ORDER — HYDROCODONE-ACETAMINOPHEN 5-325 MG PO TABS: 1.0000 | ORAL_TABLET | ORAL | 0 refills | Status: DC | PRN

## 2016-03-31 NOTE — Care Management Note (Signed)
Case Management Note  Patient Details  Name: Adam Daniel MRN: [id number] Date of Birth: 02/04/1946  Subjective/Objective:  s/p laparoscopic assisted extended right colectomy 03/28/16                   Action/Plan: Discharge Planning: AVS reviewed: Spoke to pt and independent at home. Dtr at home to assist with care. Chart reviewed. No NCM needs identified.   PCP Lajean Manes MD  Expected Discharge Date:  03/31/2016            Expected Discharge Plan:  Home/Self Care  In-House Referral:  NA  Discharge planning Services  CM Consult  Post Acute Care Choice:  NA Choice offered to:  NA  DME Arranged:  N/A DME Agency:  NA  HH Arranged:  NA HH Agency:  NA  Status of Service:  Completed, signed off  If discussed at [place] of Stay Meetings, dates discussed:    Additional Comments:  Erenest Rasher, RN 03/31/2016, 10:12 AM

## 2016-03-31 NOTE — Progress Notes (Signed)
Discharge instructions reviewed with patient and family utilizing discharge instructions no questions at time patient discharge to home.

## 2016-03-31 NOTE — Discharge Instructions (Signed)
[facility], [place] [phone]  OPEN ABDOMINAL SURGERY: POST OP INSTRUCTIONS  Always review your discharge instruction sheet given to you by the facility where your surgery was performed.  IF YOU HAVE DISABILITY OR FAMILY LEAVE FORMS, YOU MUST BRING THEM TO THE OFFICE FOR PROCESSING.  PLEASE DO NOT GIVE THEM TO YOUR DOCTOR.  1. A prescription for pain medication may be given to you upon discharge.  Take your pain medication as prescribed, if needed.  If narcotic pain medicine is not needed, then you may take acetaminophen (Tylenol) or ibuprofen (Advil) as needed. 2. Take your usually prescribed medications unless otherwise directed. 3. If you need a refill on your pain medication, please contact your pharmacy. They will contact our office to request authorization.  Prescriptions will not be filled after 5pm or on week-ends. 4. You should follow the diet you and your doctor discussed (soft diet).  Be sure to include lots of fluids daily. Most patients will experience some swelling and bruising in the area of the incision. Ice pack will help. Swelling and bruising can take several days to resolve..  5. It is common to experience some constipation if taking pain medication after surgery.  Increasing fluid intake and taking a stool softener will usually help or prevent this problem from occurring.  A mild laxative (Milk of Magnesia or Miralax) should be taken according to package directions if there are no bowel movements after 48 hours. 6.  You may have steri-strips (small skin tapes) in place directly over the incision.  These strips should be left on the skin.  If your surgeon used skin glue on the incision, you may shower in 24 hours.  The glue will flake off over the next 2-3 weeks.  Any sutures or staples will be removed at the office during your follow-up visit. You may find that a light gauze bandage over your incision may keep your staples from being rubbed or pulled. You may  shower and replace the bandage daily. 7. ACTIVITIES:  You may resume regular (light) daily activities beginning the next day--such as daily self-care, walking, climbing stairs--gradually increasing activities as tolerated.  You may have sexual intercourse when it is comfortable.  Refrain from any heavy lifting or straining for at least 6 weeks.  Do not lift anything over 10 pounds.  a. You may drive when you no longer are taking prescription pain medication, you can comfortably wear a seatbelt, and you can safely maneuver your car and apply brakes b. Return to Work: _When released to do so by doctor.__________________________________ 8. You should see your doctor in the office for a follow-up appointment approximately 2-3 weeks after your surgery.  Make sure that you call for this appointment within a day or two after you arrive home to insure a convenient appointment time. OTHER INSTRUCTIONS:  May shower on 04/01/16.  Apply dry dressing to wound daily as we discussed._____________________________________________________________ _____________________________________________________________  WHEN TO CALL YOUR DOCTOR: 1. Fever over 101.0 2. Inability to urinate 3. Nausea and/or vomiting 4. Extreme swelling or bruising 5. Continued bleeding from incision. 6. Increased pain, redness, or drainage from the incision.  The clinic staff is available to answer your questions during regular business hours.  Please dont hesitate to call and ask to speak to one of the nurses if you have concerns.  For further questions, please visit [email or web address]

## 2016-03-31 NOTE — Care Management Important Message (Signed)
Important Message  Patient Details  Name: OZIL WADLER MRN: [id number] Date of Birth: 06-10-1945   Medicare Important Message Given:  Yes    Erenest Rasher, RN 03/31/2016, 10:12 AM

## 2016-03-31 NOTE — Progress Notes (Addendum)
Assessment Dysplastic polyp of the hepatic flexure of the colon s/p laparoscopic assisted extended right colectomy 12/717-progressing well    OSA-on CPAP at night    HTN-  BP wnl; home meds restarted  Plan: Discharge.  Instructions given to him and his daughter.   LOS: 3 days     3 Days Post-Op  Subjective: Minimal pain.  Tolerating diet  Objective: Vital signs in last 24 hours: Temp:  [97.6 F (36.4 C)-99.2 F (37.3 C)] 99.2 F (37.3 C) (12/10 0617) Pulse Rate:  [51-79] 53 (12/10 0617) Resp:  [16-18] 17 (12/10 0617) BP: (107-134)/(66-74) 123/68 (12/10 0617) SpO2:  [92 %-97 %] 94 % (12/10 0617) Last BM Date: 03/30/16  Intake/Output from previous day: 12/09 0701 - 12/10 0700 In: 5040.8 [P.O.:2700; I.V.:2340.8] Out: 4450 [Urine:4450] Intake/Output this shift: No intake/output data recorded.  PE: General- In NAD Abdomen- soft, incisions clean and intact with some bruising  Lab Results:   Recent Labs  03/29/16 0430  WBC 10.9*  HGB 12.2*  HCT 36.6*  PLT 229   BMET  Recent Labs  03/29/16 0430  NA 141  K 4.5  CL 112*  CO2 23  GLUCOSE 161*  BUN 16  CREATININE 1.34*  CALCIUM 8.0*   PT/INR No results for input(s): LABPROT, INR in the last 72 hours. Comprehensive Metabolic Panel:    Component Value Date/Time   NA 141 03/29/2016 0430   NA 138 03/26/2016 1131   K 4.5 03/29/2016 0430   K 4.5 03/26/2016 1131   CL 112 (H) 03/29/2016 0430   CL 105 03/26/2016 1131   CO2 23 03/29/2016 0430   CO2 25 03/26/2016 1131   BUN 16 03/29/2016 0430   BUN 21 (H) 03/26/2016 1131   CREATININE 1.34 (H) 03/29/2016 0430   CREATININE 1.39 (H) 03/26/2016 1131   GLUCOSE 161 (H) 03/29/2016 0430   GLUCOSE 105 (H) 03/26/2016 1131   CALCIUM 8.0 (L) 03/29/2016 0430   CALCIUM 9.1 03/26/2016 1131   AST 22 03/26/2016 1131   ALT 23 03/26/2016 1131   ALKPHOS 57 03/26/2016 1131   BILITOT 0.9 03/26/2016 1131   PROT 7.0 03/26/2016 1131   ALBUMIN 4.4 03/26/2016 1131      Studies/Results: No results found.  Anti-infectives: Anti-infectives    Start     Dose/Rate Route Frequency Ordered Stop   03/28/16 1800  cefoTEtan (CEFOTAN) 2 g in dextrose 5 % 50 mL IVPB     2 g 100 mL/hr over 30 Minutes Intravenous Every 12 hours 03/28/16 1142 03/28/16 1849   03/28/16 0555  cefoTEtan (CEFOTAN) 2 g in dextrose 5 % 50 mL IVPB     2 g 100 mL/hr over 30 Minutes Intravenous On call to O.R. 03/28/16 0555 03/28/16 0749       Adam Daniel J 03/31/2016

## 2016-04-04 NOTE — Discharge Summary (Signed)
I spoke with him and his wife regarding his pathology. He has a stage I colon cancer and no further treatment is needed. He is doing fairly well at home. He'll be coming to the office tomorrow for staple removal.  Diagnosis Colon, segmental resection for tumor, extended right - INVASIVE WELL DIFFERENTIATED ADENOCARCINOMA, SPANNING 2.1 CM IN GREATEST DIMENSION. - TUMOR INVADES INTO SUBMUCOSA WITHOUT INVOLVEMENT OF MUSCULARIS PROPRIA. - MARGINS ARE NEGATIVE. - NINETEEN BENIGN LYMPH NODES WITH NO TUMOR SEEN (0/19). - SEE ONCOLOGY TEMPLATE.

## 2016-04-04 NOTE — Discharge Summary (Signed)
Physician Discharge Summary  Patient ID: Adam Daniel MRN: [id number] DOB/AGE: [birth year] 70 y.o.  Admit date: 03/28/2016 Discharge date: 03/31/2016  Admission Diagnoses:  Highly dysplastic polyp of transverse colon  Discharge Diagnoses:  Stage I adenocarcinoma of the colon s/p extended right colectomy 03/28/2016    Discharged Condition: good  Hospital Course: He underwent the procedure and was placed on the postoperative colorectal pathway. He did very well and by his third postoperative day, he had bowel function, he was tolerating his diet, he was ambulating independently. He was discharged. He will be brought back to the office in approximately 4-5 days for staple removal. Discharge instructions were given to him.   Discharge Exam: Blood pressure 126/77, pulse (!) 54, temperature 98.7 F (37.1 C), temperature source Oral, resp. rate 16, height 5\' 9"  (1.753 m), weight 82.6 kg (182 lb), SpO2 97 %.   Disposition: 01-Home or Self Care   Allergies as of 03/31/2016   No Known Allergies     Medication List    TAKE these medications   amLODipine 10 MG tablet Commonly known as:  NORVASC Take 10 mg by mouth daily.   aspirin EC 81 MG tablet Take 81 mg by mouth daily.   citalopram 20 MG tablet Commonly known as:  CELEXA Take 20 mg by mouth daily.   [employer] GENTLE LAXATIVE 5 MG EC tablet Generic drug:  bisacodyl Take 4 tablets by mouth once.   fluticasone 50 MCG/ACT nasal spray Commonly known as:  FLONASE Place 2 sprays into both nostrils daily as needed for allergies.   HYDROcodone-acetaminophen 5-325 MG tablet Commonly known as:  NORCO/VICODIN Take 1-2 tablets by mouth every 4 (four) hours as needed for moderate pain.   loratadine 10 MG tablet Commonly known as:  CLARITIN Take 10 mg by mouth daily.   losartan 100 MG tablet Commonly known as:  COZAAR Take 100 mg by mouth daily.   metroNIDAZOLE 500 MG tablet Commonly known as:  FLAGYL Take 1,000 mg by mouth  as directed. 1000 mg three times daily before procedure   neomycin 500 MG tablet Commonly known as:  MYCIFRADIN Take 1,000 mg by mouth as directed. 1000 mg three times daily before procedure   polyethylene glycol powder powder Commonly known as:  GLYCOLAX/MIRALAX Take 1 Container by mouth once. Mix with gatorade and take pre-procedure   rosuvastatin 5 MG tablet Commonly known as:  CRESTOR Take 5 mg by mouth at bedtime.        Signed: Odis Hollingshead 04/04/2016, 6:40 PM

## 2016-07-03 DIAGNOSIS — N183 Chronic kidney disease, stage 3 (moderate): Secondary | ICD-10-CM | POA: Diagnosis not present

## 2016-07-03 DIAGNOSIS — I129 Hypertensive chronic kidney disease with stage 1 through stage 4 chronic kidney disease, or unspecified chronic kidney disease: Secondary | ICD-10-CM | POA: Diagnosis not present

## 2016-07-25 DIAGNOSIS — M1711 Unilateral primary osteoarthritis, right knee: Secondary | ICD-10-CM | POA: Diagnosis not present

## 2016-09-26 DIAGNOSIS — M1711 Unilateral primary osteoarthritis, right knee: Secondary | ICD-10-CM | POA: Diagnosis not present

## 2016-11-04 DIAGNOSIS — M67842 Other specified disorders of synovium, left hand: Secondary | ICD-10-CM | POA: Diagnosis not present

## 2016-11-04 DIAGNOSIS — I129 Hypertensive chronic kidney disease with stage 1 through stage 4 chronic kidney disease, or unspecified chronic kidney disease: Secondary | ICD-10-CM | POA: Diagnosis not present

## 2016-11-04 DIAGNOSIS — N183 Chronic kidney disease, stage 3 (moderate): Secondary | ICD-10-CM | POA: Diagnosis not present

## 2016-11-08 DIAGNOSIS — M1711 Unilateral primary osteoarthritis, right knee: Secondary | ICD-10-CM | POA: Diagnosis not present

## 2016-11-14 DIAGNOSIS — Z85820 Personal history of malignant melanoma of skin: Secondary | ICD-10-CM | POA: Diagnosis not present

## 2016-11-14 DIAGNOSIS — D225 Melanocytic nevi of trunk: Secondary | ICD-10-CM | POA: Diagnosis not present

## 2016-11-14 DIAGNOSIS — D2271 Melanocytic nevi of right lower limb, including hip: Secondary | ICD-10-CM | POA: Diagnosis not present

## 2016-11-14 DIAGNOSIS — L821 Other seborrheic keratosis: Secondary | ICD-10-CM | POA: Diagnosis not present

## 2016-11-14 DIAGNOSIS — D1801 Hemangioma of skin and subcutaneous tissue: Secondary | ICD-10-CM | POA: Diagnosis not present

## 2016-11-14 DIAGNOSIS — D2272 Melanocytic nevi of left lower limb, including hip: Secondary | ICD-10-CM | POA: Diagnosis not present

## 2016-11-14 DIAGNOSIS — L570 Actinic keratosis: Secondary | ICD-10-CM | POA: Diagnosis not present

## 2016-11-22 DIAGNOSIS — D374 Neoplasm of uncertain behavior of colon: Secondary | ICD-10-CM | POA: Diagnosis not present

## 2016-11-26 DIAGNOSIS — Z85038 Personal history of other malignant neoplasm of large intestine: Secondary | ICD-10-CM | POA: Diagnosis not present

## 2016-12-19 IMAGING — CR DG CHEST 2V
2 series · 2 of 2 positions shown · non-contrast
Comparison: 03/04/2014

CLINICAL DATA: Preop for colon Surgery.

EXAM:
CHEST  2 VIEW

[w chest pa]
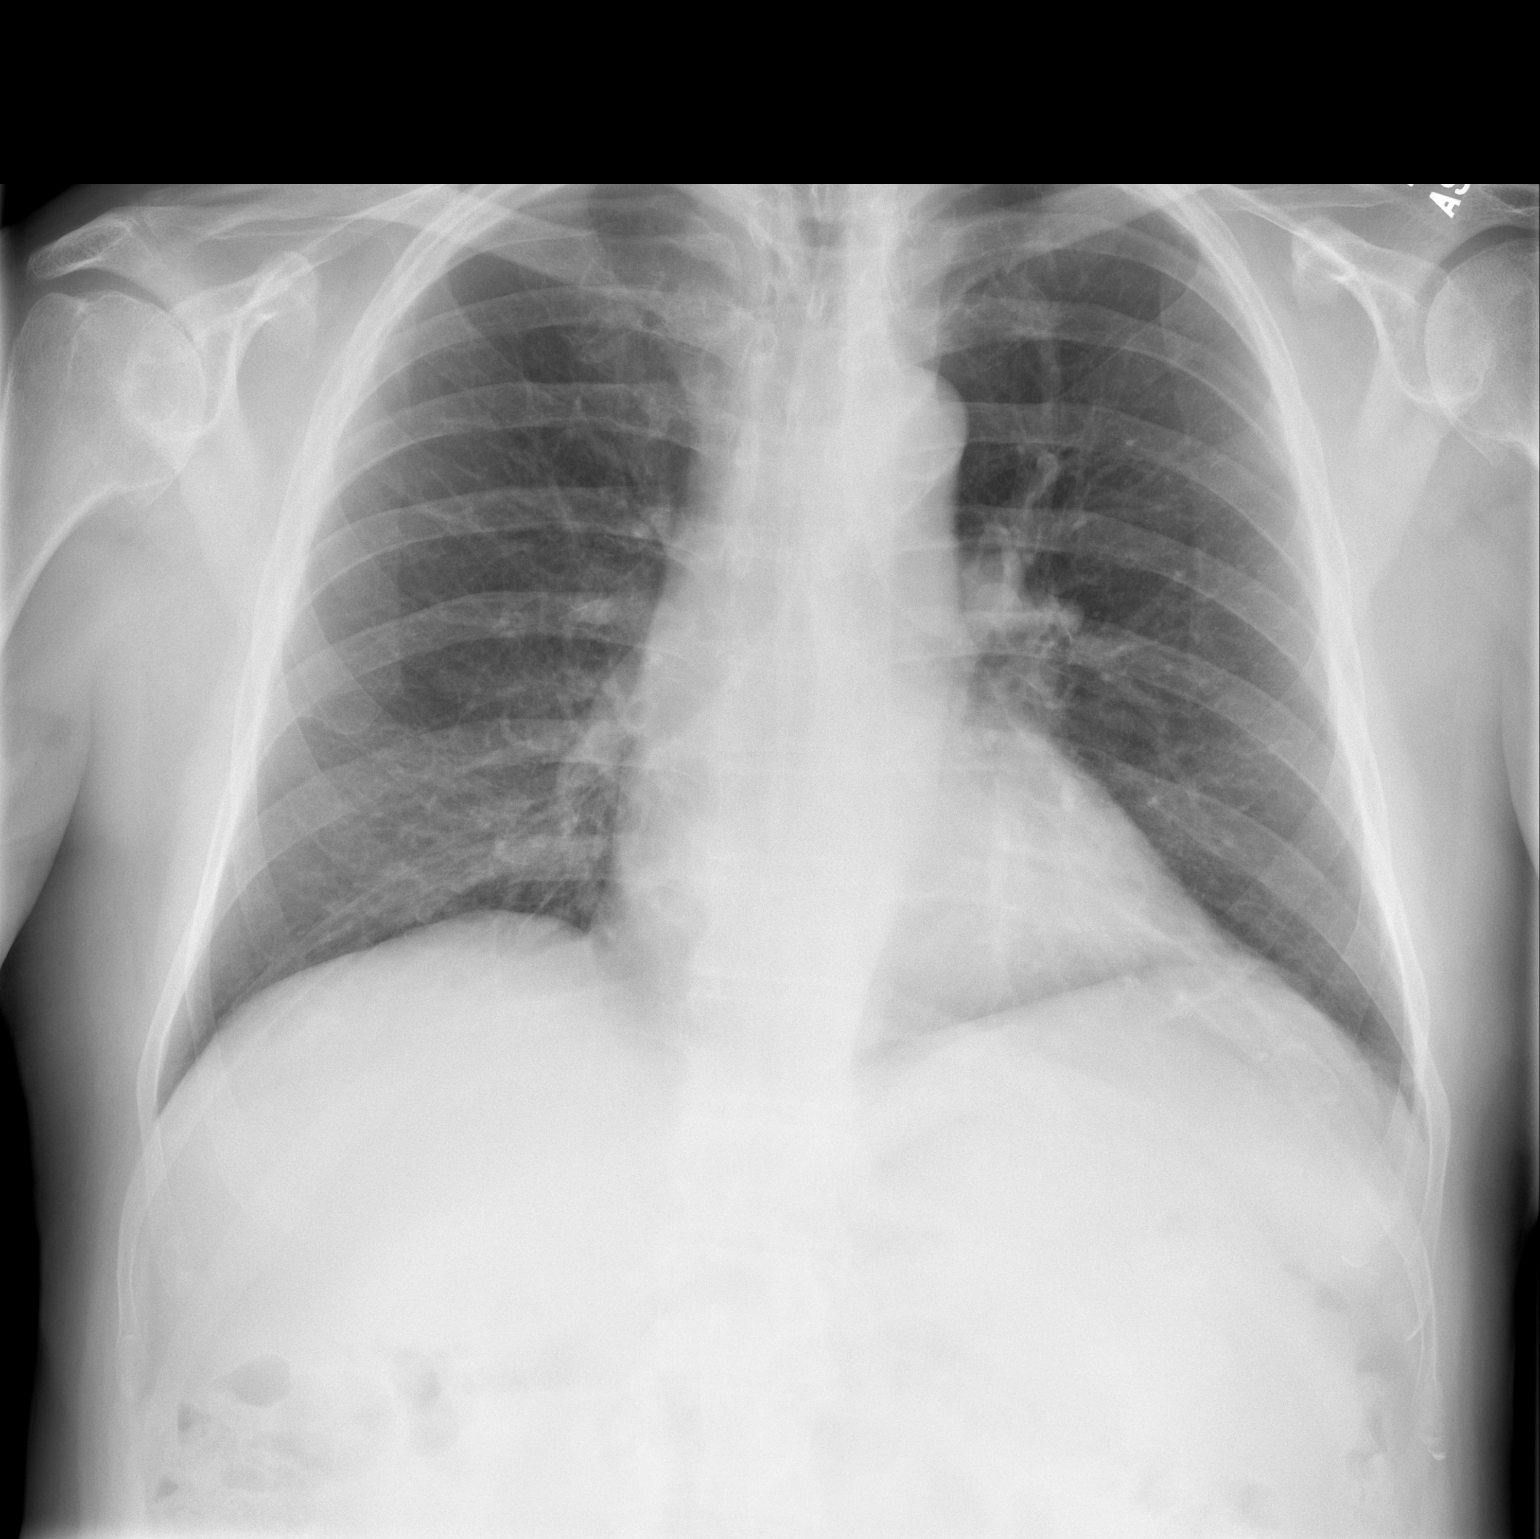

[w chest lat]
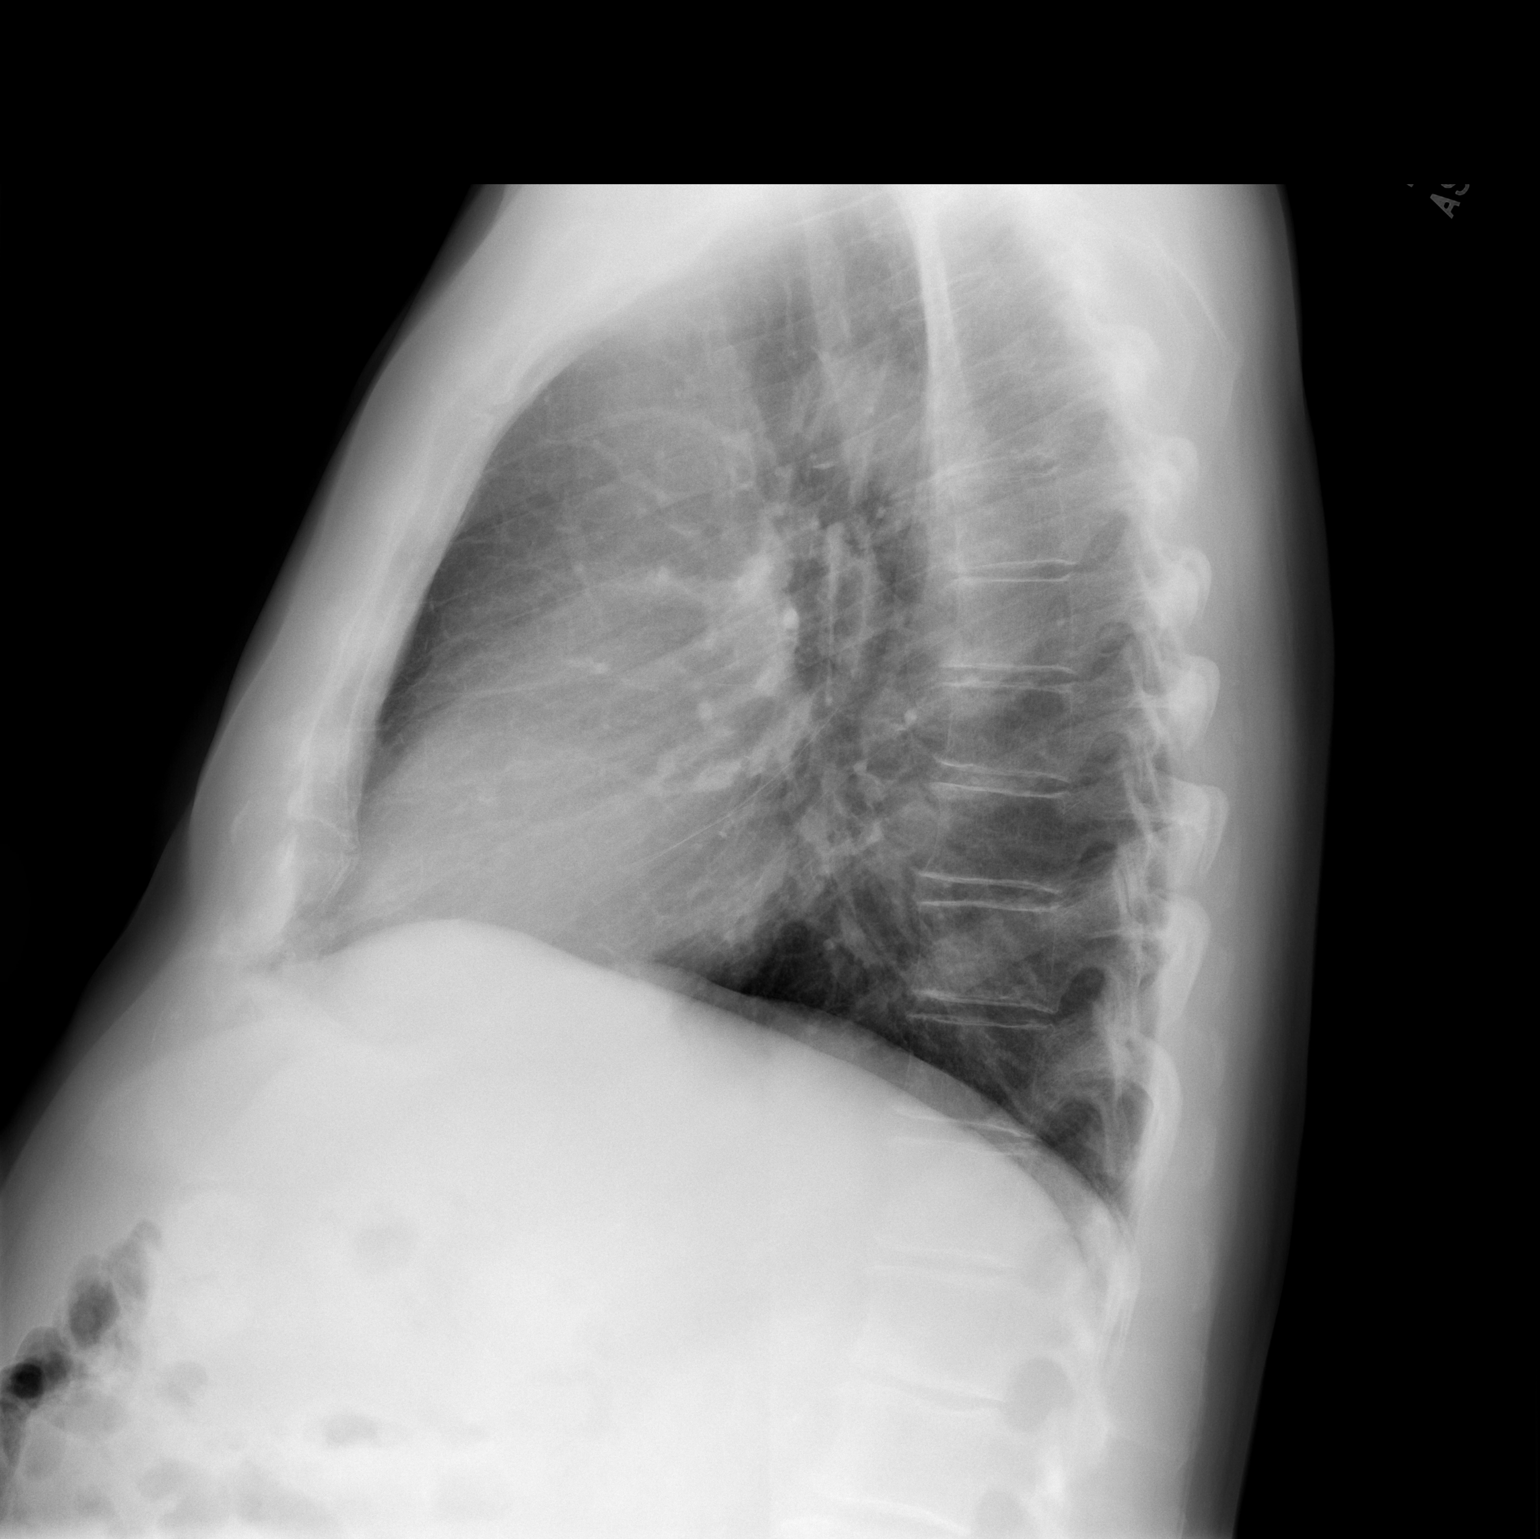

[2 of 2 positions shown; findings below may reference images not displayed]

FINDINGS: Normal heart size and mediastinal contours. No acute infiltrate or
edema. No evidence of nodule. No effusion or pneumothorax. No acute
osseous findings.
IMPRESSION: No active cardiopulmonary disease.

## 2016-12-25 DIAGNOSIS — M1711 Unilateral primary osteoarthritis, right knee: Secondary | ICD-10-CM | POA: Diagnosis not present

## 2016-12-26 DIAGNOSIS — H524 Presbyopia: Secondary | ICD-10-CM | POA: Diagnosis not present

## 2016-12-26 DIAGNOSIS — H5211 Myopia, right eye: Secondary | ICD-10-CM | POA: Diagnosis not present

## 2016-12-26 DIAGNOSIS — H52203 Unspecified astigmatism, bilateral: Secondary | ICD-10-CM | POA: Diagnosis not present

## 2017-01-01 NOTE — Progress Notes (Signed)
Please place orders in EPIC as patient is being scheduled for a pre-op appointment! Thank you! 

## 2017-01-09 ENCOUNTER — Ambulatory Visit: Payer: Self-pay | Admitting: Orthopedic Surgery

## 2017-01-09 NOTE — H&P (Signed)
Adam Daniel DOB: 10/12/1945 Married / Language: English / Race: White Male Date of Admission:  01/27/2017 CC:  Right knee pain History of Present Illness The patient is a 71 year old male who comes in for a preoperative History and Physical. The patient is scheduled for a right total knee arthroplasty to be performed by Dr. Dione Plover. Aluisio, MD at [facility] on 01/27/2017. The patient is being followed for their right knee pain and osteoarthritis. They are now months out from aspiration with cortisone injection and also post Monovisc. Symptoms reported include: pain, aching, instability and difficulty ambulating. The patient feels that they are doing poorly. Current treatment includes: home exercise program (mainly walking), bracing (copper knee sleeve), activity modification and icing at night. The patient has reported symptom improvement with: Cortisone injections while they have not gotten any relief of their symptoms with: viscosupplementation. Unfortuntely, the Monovisc made his knee pain worse. Adam Daniel is well known to the practice for endstage OA in the medial compartment of the right knee. He continues to have activity related pain and swelling especially after yard work or anything strenuous. He wears a copper fit brace which does help some of the pain but will still swell. He has noticed these flares have been increasing in intensity ove the past year or two. He has undergone aspirations in the past and cortisone injection with temporary relief. He recently received the Monovisc gel injection which unfortunately made his knee worse temporarily. He tries to remain active but the knee continues to flare up. He would like to get back to playing golf however he does not trust the knee. It has not buckled or locked but is does pop and grind. He has reached a point where he would like to proceed with surgical intervention at this time. They have been treated  conservatively in the past for the above stated problem and despite conservative measures, they continue to have progressive pain and severe functional limitations and dysfunction. They have failed non-operative management including home exercise, medications, and injections. It is felt that they would benefit from undergoing total joint replacement. Risks and benefits of the procedure have been discussed with the patient and they elect to proceed with surgery. There are no active contraindications to surgery such as ongoing infection or rapidly progressive neurological disease.   Problem List/Past Medical Knee Pain (M25.569)  High blood pressure  Hypercholesterolemia  Skin Cancer  Melanoma Primary osteoarthritis of right knee (M17.11)  Impaired Memory  Tinnitus  Shingles  Sleep Apnea  uses CPAP Hemorrhoids  Colon Cancer  Post-Colectomy Measles  Mumps  Plaque Psoriasis   Allergies  Augmentin *PENICILLINS*  rash Simvastatin *ANTIHYPERLIPIDEMICS*  Monopril *ANTIHYPERTENSIVES*   Family History  Diabetes Mellitus  mother, father, sister and brother Drug / Alcohol Addiction  grandfather fathers side Hypertension  mother, father and brother Cancer  grandfather mothers side Congestive Heart Failure  mother, father and sister Depression  grandmother fathers side Father  Deceased. age 98; Heart Failure, Diabetes Mother  Deceased. age 62; Heart Failure, Diabetes  Social History  Number of flights of stairs before winded  greater than 5 Marital status  married Living situation  live with spouse Tobacco use  never smoker Tobacco / smoke exposure  yes outdoors only Pain Contract  no Illicit drug use  no Current work status  retired Children  2 Alcohol use  current drinker; drinks beer, wine and hard liquor; less than 5 per week Exercise  Light, daily, 15 -  20 minutes, running/walking. 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 Catering manager.  Medication History Garlic (100MG  Tablet, Oral) Active. Cinnamon (500MG  Capsule, Oral) Active. Vitamin D (1 (one) Oral) Specific strength unknown - Active. Loratadine (10MG  Tablet, Oral) Active. Levitra (10MG  Tablet, Oral) Active. Aspirin (81MG  Tablet, 1 (one) Oral) Active. Centrum Silver (Oral) Active. Glucosamine Complex (1 (one) Oral) Active. Co Q10 (Oral) Specific strength unknown - Active. Fluticasone Propionate (50MCG/ACT Suspension, Nasal) Active. Citalopram Hydrobromide (20MG  Tablet, Oral) Active. Losartan Potassium (100MG  Tablet, Oral) Active. AmLODIPine Besylate (10MG  Tablet, Oral) Active. Crestor (5MG  Tablet, Oral) Active.  Past Surgical History  No pertinent past surgical history    Review of Systems  General Not Present- Chills, Fatigue, Fever, Memory Loss, Night Sweats, Weight Gain and Weight Loss. Skin Present- Psoriasis. Not Present- Eczema, Hives, Itching, Lesions and Rash. HEENT Present- Tinnitus. Not Present- Dentures, Double Vision, Headache, Hearing Loss and Visual Loss. Respiratory Present- Allergies and Snoring. Not Present- Chronic Cough, Coughing up blood, Shortness of breath at rest and Shortness of breath with exertion. Cardiovascular Present- Slow Heart Rate (Resting rate is typically around 52). Not Present- Chest Pain, Difficulty Breathing Lying Down, Murmur, Palpitations, Racing/skipping heartbeats and Swelling. Gastrointestinal Present- Hemorrhoids. Not Present- Abdominal Pain, Bloody Stool, Constipation, Diarrhea, Difficulty Swallowing, Heartburn, Jaundice, Loss of appetitie, Nausea and Vomiting. Male Genitourinary Not Present- Blood in Urine, Discharge, Flank Pain, Incontinence, Painful Urination, Urgency, Urinary  frequency, Urinary Retention, Urinating at Night and Weak urinary stream. Musculoskeletal Present- Decreased Range of Motion and Joint Pain. Not Present- Back Pain, Joint Swelling, Morning Stiffness, Muscle Pain, Muscle Weakness and Spasms. Neurological Not Present- Blackout spells, Difficulty with balance, Dizziness, Paralysis, Tremor and Weakness. Psychiatric Not Present- Insomnia. Endocrine Present- Hair Changes. Hematology Present- Easy Bruising.  Vitals Weight: 182 lb Height: 69in Weight was reported by patient. Height was reported by patient. Body Surface Area: 1.98 m Body Mass Index: 26.88 kg/m  Pulse: 68 (Regular)  BP: 114/64 (Sitting, Right Arm, Standard)   Physical Exam  General Mental Status -Alert, cooperative and good historian. General Appearance-pleasant, Not in acute distress. Orientation-Oriented X3. Build & Nutrition-Well nourished and Well developed.  Head and Neck Head-normocephalic, atraumatic . Neck Global Assessment - supple, no bruit auscultated on the right, no bruit auscultated on the left.  Eye Pupil - Bilateral-Regular and Round. Motion - Bilateral-EOMI.  Chest and Lung Exam Auscultation Breath sounds - clear at anterior chest wall and clear at posterior chest wall. Adventitious sounds - No Adventitious sounds.  Cardiovascular Auscultation Rhythm - Regular rate and rhythm. Heart Sounds - S1 WNL and S2 WNL. Murmurs & Other Heart Sounds - Auscultation of the heart reveals - No Murmurs.  Abdomen Palpation/Percussion Tenderness - Abdomen is non-tender to palpation. Rigidity (guarding) - Abdomen is soft. Auscultation Auscultation of the abdomen reveals - Bowel sounds normal.  Male Genitourinary Note: Not done, not pertinent to present illness   Musculoskeletal Note: Well-developed male alert and oriented in no apparent distress. Left knee shows no effusion. Range of motion 0-125. No medial or lateral joint line  tenderness. No instability. No crepitus on range of motion. his RIGHT knee shows no effusion. Range of motion 5-125. Moderate crepitus on range of motion. He is tender medial greater than lateral with no instability noted. Pulses sensation and motor intact both lower extremities. He has an antalgic  gait pattern on the RIGHT.  Radiographs AP and lateral of the RIGHT knee showed he has bone-on-bone change in medial compartment with some tibial subluxation and significant patellofemoral spurring.   Assessment & Plan Primary osteoarthritis of one knee, right (M17.11)  Note:Surgical Plans: Right Total Knee Replacement  Disposition: Home with family  PCP: Dr. Felipa Eth - Patient has been seen preoperatively and felt to be stable for surgery.  Topical TXA - Colon Cancer  Anesthesia Issues: Slight cognitive dysfunction following last gerneral anesthesia  Patient was instructed on what medications to stop prior to surgery.  Signed electronically by Joelene Millin, III PA-C

## 2017-01-15 NOTE — Patient Instructions (Addendum)
Kam Rahimi Milbourn  01/15/2017   Your procedure is scheduled on: 01/27/2017    Report to [facility] Main  Entrance   Follow signs to Short Stay on first floor at   [place] AM  Call this number if you have problems the morning of surgery [phone]   Remember: ONLY 1 PERSON MAY GO WITH YOU TO SHORT STAY TO GET  READY MORNING OF [place].  Do not eat food or drink liquids :After Midnight.     Take these medicines the morning of surgery with A SIP OF WATER: Amlodipine ( NORvasc), Celexa, Flonase if needed, Claritin                                 You may not have any metal on your body including hair pins and              piercings  Do not wear jewelry,  lotions, powders or perfumes, deodorant            .              Men may shave face and neck.   Do not bring valuables to the hospital. [place].  Contacts, dentures or bridgework may not be worn into surgery.  Leave suitcase in the car. After surgery it may be brought to your room.     .                  Please read over the following fact sheets you were given: _____________________________________________________________________             [facility] - Preparing for Surgery Before surgery, you can play an important role.  Because skin is not sterile, your skin needs to be as free of germs as possible.  You can reduce the number of germs on your skin by washing with CHG (chlorahexidine gluconate) soap before surgery.  CHG is an antiseptic cleaner which kills germs and bonds with the skin to continue killing germs even after washing. Please DO NOT use if you have an allergy to CHG or antibacterial soaps.  If your skin becomes reddened/irritated stop using the CHG and inform your nurse when you arrive at Short Stay. Do not shave (including legs and underarms) for at least 48 hours prior to the first CHG shower.  You may shave your  face/neck. Please follow these instructions carefully:  1.  Shower with CHG Soap the night before surgery and the  morning of Surgery.  2.  If you choose to wash your hair, wash your hair first as usual with your  normal  shampoo.  3.  After you shampoo, rinse your hair and body thoroughly to remove the  shampoo.                           4.  Use CHG as you would any other liquid soap.  You can apply chg directly  to the skin and wash                       Gently with a scrungie or clean washcloth.  5.  Apply the  CHG Soap to your body ONLY FROM THE NECK DOWN.   Do not use on face/ open                           Wound or open sores. Avoid contact with eyes, ears mouth and genitals (private parts).                       Wash face,  Genitals (private parts) with your normal soap.             6.  Wash thoroughly, paying special attention to the area where your surgery  will be performed.  7.  Thoroughly rinse your body with warm water from the neck down.  8.  DO NOT shower/wash with your normal soap after using and rinsing off  the CHG Soap.                9.  Pat yourself dry with a clean towel.            10.  Wear clean pajamas.            11.  Place clean sheets on your bed the night of your first shower and do not  sleep with pets. Day of Surgery : Do not apply any lotions/deodorants the morning of surgery.  Please wear clean clothes to the hospital/surgery center.  FAILURE TO FOLLOW THESE INSTRUCTIONS MAY RESULT IN THE CANCELLATION OF YOUR SURGERY PATIENT SIGNATURE_________________________________  NURSE SIGNATURE__________________________________  ________________________________________________________________________  WHAT IS A BLOOD TRANSFUSION? Blood Transfusion Information  A transfusion is the replacement of blood or some of its parts. Blood is made up of multiple cells which provide different functions.  Red blood cells carry oxygen and are used for blood loss  replacement.  White blood cells fight against infection.  Platelets control bleeding.  Plasma helps clot blood.  Other blood products are available for specialized needs, such as hemophilia or other clotting disorders. BEFORE THE TRANSFUSION  Who gives blood for transfusions?   Healthy volunteers who are fully evaluated to make sure their blood is safe. This is blood bank blood. Transfusion therapy is the safest it has ever been in the practice of medicine. Before blood is taken from a donor, a complete history is taken to make sure that person has no history of diseases nor engages in risky social behavior (examples are intravenous drug use or sexual activity with multiple partners). The donor's travel history is screened to minimize risk of transmitting infections, such as malaria. The donated blood is tested for signs of infectious diseases, such as HIV and hepatitis. The blood is then tested to be sure it is compatible with you in order to minimize the chance of a transfusion reaction. If you or a relative donates blood, this is often done in anticipation of surgery and is not appropriate for emergency situations. It takes many days to process the donated blood. RISKS AND COMPLICATIONS Although transfusion therapy is very safe and saves many lives, the main dangers of transfusion include:   Getting an infectious disease.  Developing a transfusion reaction. This is an allergic reaction to something in the blood you were given. Every precaution is taken to prevent this. The decision to have a blood transfusion has been considered carefully by your caregiver before blood is given. Blood is not given unless the benefits outweigh the risks. AFTER THE TRANSFUSION  Right after receiving a blood transfusion,  you will usually feel much better and more energetic. This is especially true if your red blood cells have gotten low (anemic). The transfusion raises the level of the red blood cells which  carry oxygen, and this usually causes an energy increase.  The nurse administering the transfusion will monitor you carefully for complications. HOME CARE INSTRUCTIONS  No special instructions are needed after a transfusion. You may find your energy is better. Speak with your caregiver about any limitations on activity for underlying diseases you may have. SEEK MEDICAL CARE IF:   Your condition is not improving after your transfusion.  You develop redness or irritation at the intravenous (IV) site. SEEK IMMEDIATE MEDICAL CARE IF:  Any of the following symptoms occur over the next 12 hours:  Shaking chills.  You have a temperature by mouth above 102 F (38.9 C), not controlled by medicine.  Chest, back, or muscle pain.  People around you feel you are not acting correctly or are confused.  Shortness of breath or difficulty breathing.  Dizziness and fainting.  You get a rash or develop hives.  You have a decrease in urine output.  Your urine turns a dark color or changes to pink, red, or brown. Any of the following symptoms occur over the next 10 days:  You have a temperature by mouth above 102 F (38.9 C), not controlled by medicine.  Shortness of breath.  Weakness after normal activity.  The white part of the eye turns yellow (jaundice).  You have a decrease in the amount of urine or are urinating less often.  Your urine turns a dark color or changes to pink, red, or brown. Document Released: 04/05/2000 Document Revised: 07/01/2011 Document Reviewed: 11/23/2007 Exit[employer] Patient Information 2014 [place].  _______________________________________________________________________  Incentive Spirometer  An incentive spirometer is a tool that can help keep your lungs clear and active. This tool measures how well you are filling your lungs with each breath. Taking long deep breaths may help reverse or decrease the chance of developing breathing (pulmonary) problems  (especially infection) following:  A long period of time when you are unable to move or be active. BEFORE THE PROCEDURE   If the spirometer includes an indicator to show your best effort, your nurse or respiratory therapist will set it to a desired goal.  If possible, sit up straight or lean slightly forward. Try not to slouch.  Hold the incentive spirometer in an upright position. INSTRUCTIONS FOR USE  1. Sit on the edge of your bed if possible, or sit up as far as you can in bed or on a chair. 2. Hold the incentive spirometer in an upright position. 3. Breathe out normally. 4. Place the mouthpiece in your mouth and seal your lips tightly around it. 5. Breathe in slowly and as deeply as possible, raising the piston or the ball toward the top of the column. 6. Hold your breath for 3-5 seconds or for as long as possible. Allow the piston or ball to fall to the bottom of the column. 7. Remove the mouthpiece from your mouth and breathe out normally. 8. Rest for a few seconds and repeat Steps 1 through 7 at least 10 times every 1-2 hours when you are awake. Take your time and take a few normal breaths between deep breaths. 9. The spirometer may include an indicator to show your best effort. Use the indicator as a goal to work toward during each repetition. 10. After each set of 10 deep breaths,  practice coughing to be sure your lungs are clear. If you have an incision (the cut made at the time of surgery), support your incision when coughing by placing a pillow or rolled up towels firmly against it. Once you are able to get out of bed, walk around indoors and cough well. You may stop using the incentive spirometer when instructed by your caregiver.  RISKS AND COMPLICATIONS  Take your time so you do not get dizzy or light-headed.  If you are in pain, you may need to take or ask for pain medication before doing incentive spirometry. It is harder to take a deep breath if you are having  pain. AFTER USE  Rest and breathe slowly and easily.  It can be helpful to keep track of a log of your progress. Your caregiver can provide you with a simple table to help with this. If you are using the spirometer at home, follow these instructions: [place] IF:   You are having difficultly using the spirometer.  You have trouble using the spirometer as often as instructed.  Your pain medication is not giving enough relief while using the spirometer.  You develop fever of 100.5 F (38.1 C) or higher. SEEK IMMEDIATE MEDICAL CARE IF:   You cough up bloody sputum that had not been present before.  You develop fever of 102 F (38.9 C) or greater.  You develop worsening pain at or near the incision site. MAKE SURE YOU:   Understand these instructions.  Will watch your condition.  Will get help right away if you are not doing well or get worse. Document Released: 08/19/2006 Document Revised: 07/01/2011 Document Reviewed: 10/20/2006 [facility] Patient Information 2014 [place], [place].   ________________________________________________________________________

## 2017-01-20 ENCOUNTER — Encounter (HOSPITAL_COMMUNITY)
Admission: RE | Admit: 2017-01-20 | Discharge: 2017-01-20 | Disposition: A | Discharge status: Home / Self Care | Payer: Medicare Other | Source: Ambulatory Visit | Attending: Orthopedic Surgery | Admitting: Orthopedic Surgery

## 2017-01-20 ENCOUNTER — Encounter (HOSPITAL_COMMUNITY): Payer: Self-pay

## 2017-01-20 DIAGNOSIS — Z01818 Encounter for other preprocedural examination: Secondary | ICD-10-CM | POA: Diagnosis not present

## 2017-01-20 DIAGNOSIS — M1711 Unilateral primary osteoarthritis, right knee: Secondary | ICD-10-CM | POA: Insufficient documentation

## 2017-01-20 HISTORY — DX: Psoriasis vulgaris: L40.0

## 2017-01-20 HISTORY — DX: Bradycardia, unspecified: R00.1

## 2017-01-20 HISTORY — DX: Adverse effect of unspecified anesthetic, initial encounter: T41.45XA

## 2017-01-20 HISTORY — DX: Other complications of anesthesia, initial encounter: T88.59XA

## 2017-01-20 LAB — SURGICAL PCR SCREEN
MRSA, PCR: NEGATIVE
Staphylococcus aureus: NEGATIVE

## 2017-01-20 LAB — COMPREHENSIVE METABOLIC PANEL
ALBUMIN: 4.1 g/dL (ref 3.5–5.0)
ALK PHOS: 55 U/L (ref 38–126)
ALT: 20 U/L (ref 17–63)
ANION GAP: 9 (ref 5–15)
AST: 25 U/L (ref 15–41)
BUN: 20 mg/dL (ref 6–20)
CALCIUM: 9 mg/dL (ref 8.9–10.3)
CO2: 24 mmol/L (ref 22–32)
Chloride: 107 mmol/L (ref 101–111)
Creatinine, Ser: 1.27 mg/dL — ABNORMAL HIGH (ref 0.61–1.24)
GFR calc Af Amer: >60 mL/min (ref >60)
GFR calc non Af Amer: 54 mL/min — ABNORMAL LOW (ref >60)
GLUCOSE: 99 mg/dL (ref 65–99)
POTASSIUM: 4.3 mmol/L (ref 3.5–5.1)
SODIUM: 140 mmol/L (ref 135–145)
Total Bilirubin: 0.4 mg/dL (ref 0.3–1.2)
Total Protein: 6.8 g/dL (ref 6.5–8.1)

## 2017-01-20 LAB — PROTIME-INR
INR: 0.94
Prothrombin Time: 12.5 seconds (ref 11.4–15.2)

## 2017-01-20 LAB — APTT: APTT: 29 s (ref 24–36)

## 2017-01-20 LAB — CBC
HCT: 43.3 % (ref 39.0–52.0)
HEMOGLOBIN: 14.9 g/dL (ref 13.0–17.0)
MCH: 30.9 pg (ref 26.0–34.0)
MCHC: 34.4 g/dL (ref 30.0–36.0)
MCV: 89.8 fL (ref 78.0–100.0)
Platelets: 271 10*3/uL (ref 150–400)
RBC: 4.82 MIL/uL (ref 4.22–5.81)
RDW: 13.3 % (ref 11.5–15.5)
WBC: 6.6 10*3/uL (ref 4.0–10.5)

## 2017-01-20 NOTE — Progress Notes (Signed)
DR Felipa Eth 11/04/16-on chart  03/26/16-ekg-epic CXR-03/07/16-epic

## 2017-01-24 DIAGNOSIS — Z23 Encounter for immunization: Secondary | ICD-10-CM | POA: Diagnosis not present

## 2017-01-24 DIAGNOSIS — G4730 Sleep apnea, unspecified: Secondary | ICD-10-CM | POA: Diagnosis not present

## 2017-01-24 DIAGNOSIS — Z79899 Other long term (current) drug therapy: Secondary | ICD-10-CM | POA: Diagnosis not present

## 2017-01-24 DIAGNOSIS — N183 Chronic kidney disease, stage 3 (moderate): Secondary | ICD-10-CM | POA: Diagnosis not present

## 2017-01-24 DIAGNOSIS — F325 Major depressive disorder, single episode, in full remission: Secondary | ICD-10-CM | POA: Diagnosis not present

## 2017-01-24 DIAGNOSIS — Z85038 Personal history of other malignant neoplasm of large intestine: Secondary | ICD-10-CM | POA: Diagnosis not present

## 2017-01-24 DIAGNOSIS — I129 Hypertensive chronic kidney disease with stage 1 through stage 4 chronic kidney disease, or unspecified chronic kidney disease: Secondary | ICD-10-CM | POA: Diagnosis not present

## 2017-01-24 DIAGNOSIS — Z0000 Encounter for general adult medical examination without abnormal findings: Secondary | ICD-10-CM | POA: Diagnosis not present

## 2017-01-24 DIAGNOSIS — E7800 Pure hypercholesterolemia, unspecified: Secondary | ICD-10-CM | POA: Diagnosis not present

## 2017-01-24 DIAGNOSIS — Z125 Encounter for screening for malignant neoplasm of prostate: Secondary | ICD-10-CM | POA: Diagnosis not present

## 2017-01-27 ENCOUNTER — Inpatient Hospital Stay (HOSPITAL_COMMUNITY): Payer: Medicare Other | Admitting: Anesthesiology

## 2017-01-27 ENCOUNTER — Encounter (HOSPITAL_COMMUNITY)
Admission: RE | Disposition: A | Discharge status: Home / Self Care | Payer: Self-pay | Source: Ambulatory Visit | Attending: Orthopedic Surgery

## 2017-01-27 ENCOUNTER — Inpatient Hospital Stay (HOSPITAL_COMMUNITY)
Admission: RE | Admit: 2017-01-27 | Discharge: 2017-01-29 | DRG: 470 | Disposition: A | Discharge status: Home / Self Care | Payer: Medicare Other | Source: Ambulatory Visit | Attending: Orthopedic Surgery | Admitting: Orthopedic Surgery

## 2017-01-27 ENCOUNTER — Encounter (HOSPITAL_COMMUNITY): Payer: Self-pay

## 2017-01-27 DIAGNOSIS — N189 Chronic kidney disease, unspecified: Secondary | ICD-10-CM | POA: Diagnosis not present

## 2017-01-27 DIAGNOSIS — K635 Polyp of colon: Secondary | ICD-10-CM | POA: Diagnosis not present

## 2017-01-27 DIAGNOSIS — Z818 Family history of other mental and behavioral disorders: Secondary | ICD-10-CM

## 2017-01-27 DIAGNOSIS — G4730 Sleep apnea, unspecified: Secondary | ICD-10-CM | POA: Diagnosis present

## 2017-01-27 DIAGNOSIS — Z85038 Personal history of other malignant neoplasm of large intestine: Secondary | ICD-10-CM | POA: Diagnosis not present

## 2017-01-27 DIAGNOSIS — Z833 Family history of diabetes mellitus: Secondary | ICD-10-CM | POA: Diagnosis not present

## 2017-01-27 DIAGNOSIS — Z7951 Long term (current) use of inhaled steroids: Secondary | ICD-10-CM

## 2017-01-27 DIAGNOSIS — H9319 Tinnitus, unspecified ear: Secondary | ICD-10-CM | POA: Diagnosis present

## 2017-01-27 DIAGNOSIS — L400 Psoriasis vulgaris: Secondary | ICD-10-CM | POA: Diagnosis not present

## 2017-01-27 DIAGNOSIS — Z880 Allergy status to penicillin: Secondary | ICD-10-CM

## 2017-01-27 DIAGNOSIS — Z8249 Family history of ischemic heart disease and other diseases of the circulatory system: Secondary | ICD-10-CM

## 2017-01-27 DIAGNOSIS — E7800 Pure hypercholesterolemia, unspecified: Secondary | ICD-10-CM | POA: Diagnosis not present

## 2017-01-27 DIAGNOSIS — Z7982 Long term (current) use of aspirin: Secondary | ICD-10-CM

## 2017-01-27 DIAGNOSIS — Z809 Family history of malignant neoplasm, unspecified: Secondary | ICD-10-CM

## 2017-01-27 DIAGNOSIS — Z85820 Personal history of malignant melanoma of skin: Secondary | ICD-10-CM

## 2017-01-27 DIAGNOSIS — M1711 Unilateral primary osteoarthritis, right knee: Secondary | ICD-10-CM | POA: Diagnosis not present

## 2017-01-27 DIAGNOSIS — M179 Osteoarthritis of knee, unspecified: Secondary | ICD-10-CM | POA: Diagnosis present

## 2017-01-27 DIAGNOSIS — Z888 Allergy status to other drugs, medicaments and biological substances status: Secondary | ICD-10-CM | POA: Diagnosis not present

## 2017-01-27 DIAGNOSIS — M25561 Pain in right knee: Secondary | ICD-10-CM | POA: Diagnosis not present

## 2017-01-27 DIAGNOSIS — I129 Hypertensive chronic kidney disease with stage 1 through stage 4 chronic kidney disease, or unspecified chronic kidney disease: Secondary | ICD-10-CM | POA: Diagnosis not present

## 2017-01-27 DIAGNOSIS — M1710 Unilateral primary osteoarthritis, unspecified knee: Secondary | ICD-10-CM | POA: Diagnosis present

## 2017-01-27 DIAGNOSIS — G8918 Other acute postprocedural pain: Secondary | ICD-10-CM | POA: Diagnosis not present

## 2017-01-27 HISTORY — PX: TOTAL KNEE ARTHROPLASTY: SHX125

## 2017-01-27 LAB — TYPE AND SCREEN
ABO/RH(D): A NEG
ANTIBODY SCREEN: NEGATIVE

## 2017-01-27 SURGERY — ARTHROPLASTY, KNEE, TOTAL
Anesthesia: Spinal | Site: Knee | Laterality: Right

## 2017-01-27 MED ORDER — CEFAZOLIN SODIUM-DEXTROSE 2-4 GM/100ML-% IV SOLN
2.0000 g | INTRAVENOUS | Status: AC
  Administered 2017-01-27: 2 g via INTRAVENOUS

## 2017-01-27 MED ORDER — MORPHINE SULFATE (PF) 4 MG/ML IV SOLN: 1.0000 mg | INTRAVENOUS | Status: DC | PRN

## 2017-01-27 MED ORDER — METHOCARBAMOL 500 MG PO TABS
500.0000 mg | ORAL_TABLET | Freq: Four times a day (QID) | ORAL | Status: DC | PRN
  Administered 2017-01-27 – 2017-01-29 (×4): 500 mg via ORAL
  Filled 2017-01-27 (×4): qty 1

## 2017-01-27 MED ORDER — DEXTROSE 5 % IV SOLN
500.0000 mg | Freq: Four times a day (QID) | INTRAVENOUS | Status: DC | PRN
  Filled 2017-01-27: qty 5

## 2017-01-27 MED ORDER — BUPIVACAINE LIPOSOME 1.3 % IJ SUSP
INTRAMUSCULAR | Status: DC | PRN
  Administered 2017-01-27: 20 mL

## 2017-01-27 MED ORDER — BUPIVACAINE IN DEXTROSE 0.75-8.25 % IT SOLN
INTRATHECAL | Status: DC | PRN
  Administered 2017-01-27: 2 mL via INTRATHECAL

## 2017-01-27 MED ORDER — SODIUM CHLORIDE 0.9 % IJ SOLN
INTRAMUSCULAR | Status: AC
  Filled 2017-01-27: qty 10

## 2017-01-27 MED ORDER — ACETAMINOPHEN 325 MG PO TABS: 650.0000 mg | ORAL_TABLET | Freq: Four times a day (QID) | ORAL | Status: DC | PRN

## 2017-01-27 MED ORDER — ONDANSETRON HCL 4 MG/2ML IJ SOLN
INTRAMUSCULAR | Status: DC | PRN
  Administered 2017-01-27: 4 mg via INTRAVENOUS

## 2017-01-27 MED ORDER — FENTANYL CITRATE (PF) 100 MCG/2ML IJ SOLN
INTRAMUSCULAR | Status: AC
  Filled 2017-01-27: qty 2

## 2017-01-27 MED ORDER — ACETAMINOPHEN 650 MG RE SUPP: 650.0000 mg | Freq: Four times a day (QID) | RECTAL | Status: DC | PRN

## 2017-01-27 MED ORDER — SODIUM CHLORIDE 0.9 % IJ SOLN
INTRAMUSCULAR | Status: AC
  Filled 2017-01-27: qty 50

## 2017-01-27 MED ORDER — FLUTICASONE PROPIONATE 50 MCG/ACT NA SUSP
2.0000 | Freq: Every day | NASAL | Status: DC | PRN
  Filled 2017-01-27: qty 16

## 2017-01-27 MED ORDER — PHENOL 1.4 % MT LIQD: 1.0000 | OROMUCOSAL | Status: DC | PRN

## 2017-01-27 MED ORDER — ONDANSETRON HCL 4 MG/2ML IJ SOLN
INTRAMUSCULAR | Status: AC
  Filled 2017-01-27: qty 2

## 2017-01-27 MED ORDER — ONDANSETRON HCL 4 MG/2ML IJ SOLN: 4.0000 mg | Freq: Four times a day (QID) | INTRAMUSCULAR | Status: DC | PRN

## 2017-01-27 MED ORDER — ACETAMINOPHEN 500 MG PO TABS
1000.0000 mg | ORAL_TABLET | Freq: Four times a day (QID) | ORAL | Status: AC
  Administered 2017-01-27 – 2017-01-28 (×4): 1000 mg via ORAL
  Filled 2017-01-27 (×3): qty 2

## 2017-01-27 MED ORDER — MIDAZOLAM HCL 2 MG/2ML IJ SOLN
INTRAMUSCULAR | Status: AC
  Filled 2017-01-27: qty 2

## 2017-01-27 MED ORDER — METOCLOPRAMIDE HCL 5 MG/ML IJ SOLN: 5.0000 mg | Freq: Three times a day (TID) | INTRAMUSCULAR | Status: DC | PRN

## 2017-01-27 MED ORDER — DIPHENHYDRAMINE HCL 12.5 MG/5ML PO ELIX: 12.5000 mg | ORAL_SOLUTION | ORAL | Status: DC | PRN

## 2017-01-27 MED ORDER — TRANEXAMIC ACID 1000 MG/10ML IV SOLN
INTRAVENOUS | Status: AC | PRN
  Administered 2017-01-27: 2000 mg via TOPICAL

## 2017-01-27 MED ORDER — CITALOPRAM HYDROBROMIDE 20 MG PO TABS
20.0000 mg | ORAL_TABLET | Freq: Every day | ORAL | Status: DC
  Administered 2017-01-28: 08:00:00 20 mg via ORAL
  Filled 2017-01-27: qty 1

## 2017-01-27 MED ORDER — MENTHOL 3 MG MT LOZG: 1.0000 | LOZENGE | OROMUCOSAL | Status: DC | PRN

## 2017-01-27 MED ORDER — TRANEXAMIC ACID 1000 MG/10ML IV SOLN
2000.0000 mg | Freq: Once | INTRAVENOUS | Status: DC
  Filled 2017-01-27: qty 20

## 2017-01-27 MED ORDER — SODIUM CHLORIDE 0.9 % IR SOLN
Status: DC | PRN
  Administered 2017-01-27: 1000 mL

## 2017-01-27 MED ORDER — OXYCODONE HCL 5 MG/5ML PO SOLN: 5.0000 mg | Freq: Once | ORAL | Status: DC | PRN

## 2017-01-27 MED ORDER — PROPOFOL 10 MG/ML IV BOLUS
INTRAVENOUS | Status: AC
  Filled 2017-01-27: qty 20

## 2017-01-27 MED ORDER — GABAPENTIN 300 MG PO CAPS
300.0000 mg | ORAL_CAPSULE | Freq: Once | ORAL | Status: AC
  Administered 2017-01-27: 300 mg via ORAL

## 2017-01-27 MED ORDER — LACTATED RINGERS IV SOLN
INTRAVENOUS | Status: DC
  Administered 2017-01-27: 1000 mL via INTRAVENOUS

## 2017-01-27 MED ORDER — CEFAZOLIN SODIUM-DEXTROSE 2-4 GM/100ML-% IV SOLN
2.0000 g | Freq: Four times a day (QID) | INTRAVENOUS | Status: AC
  Administered 2017-01-27 (×2): 2 g via INTRAVENOUS
  Filled 2017-01-27 (×2): qty 100

## 2017-01-27 MED ORDER — PROMETHAZINE HCL 25 MG/ML IJ SOLN: 6.2500 mg | INTRAMUSCULAR | Status: DC | PRN

## 2017-01-27 MED ORDER — PROPOFOL 10 MG/ML IV BOLUS
INTRAVENOUS | Status: AC
  Filled 2017-01-27: qty 40

## 2017-01-27 MED ORDER — DEXAMETHASONE SODIUM PHOSPHATE 10 MG/ML IJ SOLN
10.0000 mg | Freq: Once | INTRAMUSCULAR | Status: AC
  Administered 2017-01-27: 10 mg via INTRAVENOUS

## 2017-01-27 MED ORDER — MIDAZOLAM HCL 5 MG/5ML IJ SOLN
INTRAMUSCULAR | Status: DC | PRN
  Administered 2017-01-27 (×2): 1 mg via INTRAVENOUS

## 2017-01-27 MED ORDER — DOCUSATE SODIUM 100 MG PO CAPS
100.0000 mg | ORAL_CAPSULE | Freq: Two times a day (BID) | ORAL | Status: DC
  Administered 2017-01-27 – 2017-01-28 (×3): 100 mg via ORAL
  Filled 2017-01-27 (×3): qty 1

## 2017-01-27 MED ORDER — ROPIVACAINE HCL 5 MG/ML IJ SOLN
INTRAMUSCULAR | Status: DC | PRN
  Administered 2017-01-27: 20 mL via PERINEURAL

## 2017-01-27 MED ORDER — DEXAMETHASONE SODIUM PHOSPHATE 10 MG/ML IJ SOLN
INTRAMUSCULAR | Status: AC
  Filled 2017-01-27: qty 1

## 2017-01-27 MED ORDER — LACTATED RINGERS IV SOLN
INTRAVENOUS | Status: DC | PRN
  Administered 2017-01-27: 06:00:00 via INTRAVENOUS

## 2017-01-27 MED ORDER — ACETAMINOPHEN 10 MG/ML IV SOLN
INTRAVENOUS | Status: AC
  Filled 2017-01-27: qty 100

## 2017-01-27 MED ORDER — RIVAROXABAN 10 MG PO TABS
10.0000 mg | ORAL_TABLET | Freq: Every day | ORAL | Status: DC
  Administered 2017-01-28 – 2017-01-29 (×2): 10 mg via ORAL
  Filled 2017-01-27 (×2): qty 1

## 2017-01-27 MED ORDER — ROSUVASTATIN CALCIUM 5 MG PO TABS
5.0000 mg | ORAL_TABLET | Freq: Every day | ORAL | Status: DC
  Administered 2017-01-27 – 2017-01-28 (×2): 5 mg via ORAL
  Filled 2017-01-27 (×2): qty 1

## 2017-01-27 MED ORDER — GABAPENTIN 300 MG PO CAPS
ORAL_CAPSULE | ORAL | Status: AC
  Administered 2017-01-27: 300 mg via ORAL
  Filled 2017-01-27: qty 1

## 2017-01-27 MED ORDER — BISACODYL 10 MG RE SUPP: 10.0000 mg | Freq: Every day | RECTAL | Status: DC | PRN

## 2017-01-27 MED ORDER — SODIUM CHLORIDE 0.9 % IJ SOLN
INTRAMUSCULAR | Status: DC | PRN
  Administered 2017-01-27: 60 mL

## 2017-01-27 MED ORDER — CHLORHEXIDINE GLUCONATE 4 % EX LIQD: 60.0000 mL | Freq: Once | CUTANEOUS | Status: DC

## 2017-01-27 MED ORDER — OXYCODONE HCL 5 MG PO TABS
5.0000 mg | ORAL_TABLET | ORAL | Status: DC | PRN
  Administered 2017-01-27 – 2017-01-28 (×7): 10 mg via ORAL
  Administered 2017-01-28: 5 mg via ORAL
  Administered 2017-01-28: 08:00:00 10 mg via ORAL
  Administered 2017-01-28: 02:00:00 5 mg via ORAL
  Administered 2017-01-29 (×2): 10 mg via ORAL
  Filled 2017-01-27 (×13): qty 2

## 2017-01-27 MED ORDER — FENTANYL CITRATE (PF) 100 MCG/2ML IJ SOLN
INTRAMUSCULAR | Status: DC | PRN
  Administered 2017-01-27 (×2): 50 ug via INTRAVENOUS

## 2017-01-27 MED ORDER — POLYETHYLENE GLYCOL 3350 17 G PO PACK: 17.0000 g | PACK | Freq: Every day | ORAL | Status: DC | PRN

## 2017-01-27 MED ORDER — FLEET ENEMA 7-19 GM/118ML RE ENEM: 1.0000 | ENEMA | Freq: Once | RECTAL | Status: DC | PRN

## 2017-01-27 MED ORDER — LORATADINE 10 MG PO TABS
10.0000 mg | ORAL_TABLET | Freq: Every day | ORAL | Status: DC
  Administered 2017-01-28: 08:00:00 10 mg via ORAL
  Filled 2017-01-27: qty 1

## 2017-01-27 MED ORDER — SODIUM CHLORIDE 0.9 % IV SOLN
INTRAVENOUS | Status: DC
  Administered 2017-01-27: 14:00:00 via INTRAVENOUS

## 2017-01-27 MED ORDER — LOSARTAN POTASSIUM 50 MG PO TABS
100.0000 mg | ORAL_TABLET | Freq: Every day | ORAL | Status: DC
  Administered 2017-01-28: 100 mg via ORAL
  Filled 2017-01-27: qty 2

## 2017-01-27 MED ORDER — ONDANSETRON HCL 4 MG PO TABS: 4.0000 mg | ORAL_TABLET | Freq: Four times a day (QID) | ORAL | Status: DC | PRN

## 2017-01-27 MED ORDER — METOCLOPRAMIDE HCL 5 MG PO TABS: 5.0000 mg | ORAL_TABLET | Freq: Three times a day (TID) | ORAL | Status: DC | PRN

## 2017-01-27 MED ORDER — CEFAZOLIN SODIUM-DEXTROSE 2-4 GM/100ML-% IV SOLN
INTRAVENOUS | Status: AC
  Filled 2017-01-27: qty 100

## 2017-01-27 MED ORDER — EPHEDRINE SULFATE-NACL 50-0.9 MG/10ML-% IV SOSY
PREFILLED_SYRINGE | INTRAVENOUS | Status: DC | PRN
  Administered 2017-01-27 (×2): 10 mg via INTRAVENOUS
  Administered 2017-01-27 (×2): 5 mg via INTRAVENOUS

## 2017-01-27 MED ORDER — HYDROMORPHONE HCL-NACL 0.5-0.9 MG/ML-% IV SOSY: 0.2500 mg | PREFILLED_SYRINGE | INTRAVENOUS | Status: DC | PRN

## 2017-01-27 MED ORDER — OXYCODONE HCL 5 MG PO TABS: 5.0000 mg | ORAL_TABLET | Freq: Once | ORAL | Status: DC | PRN

## 2017-01-27 MED ORDER — BUPIVACAINE LIPOSOME 1.3 % IJ SUSP
20.0000 mL | Freq: Once | INTRAMUSCULAR | Status: DC
  Filled 2017-01-27: qty 20

## 2017-01-27 MED ORDER — DEXAMETHASONE SODIUM PHOSPHATE 10 MG/ML IJ SOLN
10.0000 mg | Freq: Once | INTRAMUSCULAR | Status: AC
  Administered 2017-01-28: 10 mg via INTRAVENOUS
  Filled 2017-01-27: qty 1

## 2017-01-27 MED ORDER — AMLODIPINE BESYLATE 10 MG PO TABS
10.0000 mg | ORAL_TABLET | Freq: Every day | ORAL | Status: DC
  Administered 2017-01-28: 08:00:00 10 mg via ORAL
  Filled 2017-01-27: qty 1

## 2017-01-27 MED ORDER — PROPOFOL 500 MG/50ML IV EMUL
INTRAVENOUS | Status: DC | PRN
  Administered 2017-01-27: 75 ug/kg/min via INTRAVENOUS

## 2017-01-27 MED ORDER — ACETAMINOPHEN 10 MG/ML IV SOLN
1000.0000 mg | Freq: Once | INTRAVENOUS | Status: AC
  Administered 2017-01-27: 1000 mg via INTRAVENOUS

## 2017-01-27 MED ORDER — PROPOFOL 10 MG/ML IV BOLUS
INTRAVENOUS | Status: DC | PRN
  Administered 2017-01-27: 30 mg via INTRAVENOUS
  Administered 2017-01-27: 20 mg via INTRAVENOUS

## 2017-01-27 MED ORDER — EPHEDRINE 5 MG/ML INJ
INTRAVENOUS | Status: AC
  Filled 2017-01-27: qty 10

## 2017-01-27 SURGICAL SUPPLY — 51 items
BAG DECANTER FOR FLEXI CONT (MISCELLANEOUS) ×3 IMPLANT
BAG SPEC THK2 15X12 ZIP CLS (MISCELLANEOUS) ×1
BAG ZIPLOCK 12X15 (MISCELLANEOUS) ×3 IMPLANT
BANDAGE ACE 6X5 VEL STRL LF (GAUZE/BANDAGES/DRESSINGS) ×3 IMPLANT
BLADE SAG 18X100X1.27 (BLADE) ×3 IMPLANT
BLADE SAW SGTL 11.0X1.19X90.0M (BLADE) ×3 IMPLANT
BOWL SMART MIX CTS (DISPOSABLE) ×3 IMPLANT
CAP KNEE TOTAL 3 SIGMA ×3 IMPLANT
CEMENT HV SMART SET (Cement) ×6 IMPLANT
CLOSURE WOUND 1/2 X4 (GAUZE/BANDAGES/DRESSINGS) ×1
COVER SURGICAL LIGHT HANDLE (MISCELLANEOUS) ×3 IMPLANT
CUFF TOURN SGL QUICK 34 (TOURNIQUET CUFF) ×3
CUFF TRNQT CYL 34X4X40X1 (TOURNIQUET CUFF) ×1 IMPLANT
DECANTER SPIKE VIAL GLASS SM (MISCELLANEOUS) ×3 IMPLANT
DRAPE U-SHAPE 47X51 STRL (DRAPES) ×3 IMPLANT
DRSG ADAPTIC 3X8 NADH LF (GAUZE/BANDAGES/DRESSINGS) ×3 IMPLANT
DRSG PAD ABDOMINAL 8X10 ST (GAUZE/BANDAGES/DRESSINGS) ×3 IMPLANT
DURAPREP 26ML APPLICATOR (WOUND CARE) ×3 IMPLANT
ELECT REM PT RETURN 15FT ADLT (MISCELLANEOUS) ×3 IMPLANT
EVACUATOR 1/8 PVC DRAIN (DRAIN) ×3 IMPLANT
GAUZE SPONGE 4X4 12PLY STRL (GAUZE/BANDAGES/DRESSINGS) ×3 IMPLANT
GLOVE BIO SURGEON STRL SZ7.5 (GLOVE) IMPLANT
GLOVE BIO SURGEON STRL SZ8 (GLOVE) ×3 IMPLANT
GLOVE BIOGEL PI IND STRL 6.5 (GLOVE) ×1 IMPLANT
GLOVE BIOGEL PI IND STRL 8 (GLOVE) ×1 IMPLANT
GLOVE BIOGEL PI INDICATOR 6.5 (GLOVE) ×2
GLOVE BIOGEL PI INDICATOR 8 (GLOVE) ×2
GLOVE SURG SS PI 6.5 STRL IVOR (GLOVE) ×3 IMPLANT
GOWN STRL REUS W/TWL LRG LVL3 (GOWN DISPOSABLE) ×3 IMPLANT
GOWN STRL REUS W/TWL XL LVL3 (GOWN DISPOSABLE) IMPLANT
HANDPIECE INTERPULSE COAX TIP (DISPOSABLE) ×3
IMMOBILIZER KNEE 20 (SOFTGOODS) ×3
IMMOBILIZER KNEE 20 THIGH 36 (SOFTGOODS) ×1 IMPLANT
MANIFOLD NEPTUNE II (INSTRUMENTS) ×3 IMPLANT
NS IRRIG 1000ML POUR BTL (IV SOLUTION) ×3 IMPLANT
PACK TOTAL KNEE CUSTOM (KITS) ×3 IMPLANT
PAD ABD 8X10 STRL (GAUZE/BANDAGES/DRESSINGS) ×3 IMPLANT
PADDING CAST COTTON 6X4 STRL (CAST SUPPLIES) ×6 IMPLANT
POSITIONER SURGICAL ARM (MISCELLANEOUS) ×3 IMPLANT
SET HNDPC FAN SPRY TIP SCT (DISPOSABLE) ×1 IMPLANT
STRIP CLOSURE SKIN 1/2X4 (GAUZE/BANDAGES/DRESSINGS) ×2 IMPLANT
SUT MNCRL AB 4-0 PS2 18 (SUTURE) ×3 IMPLANT
SUT STRATAFIX 0 PDS 27 VIOLET (SUTURE) ×3
SUT VIC AB 2-0 CT1 27 (SUTURE) ×9
SUT VIC AB 2-0 CT1 TAPERPNT 27 (SUTURE) ×3 IMPLANT
SUTURE STRATFX 0 PDS 27 VIOLET (SUTURE) ×1 IMPLANT
SYR 30ML LL (SYRINGE) ×6 IMPLANT
TRAY FOLEY W/METER SILVER 16FR (SET/KITS/TRAYS/PACK) ×3 IMPLANT
WATER STERILE IRR 1000ML POUR (IV SOLUTION) ×6 IMPLANT
WRAP KNEE MAXI GEL POST OP (GAUZE/BANDAGES/DRESSINGS) ×3 IMPLANT
YANKAUER SUCT BULB TIP 10FT TU (MISCELLANEOUS) ×3 IMPLANT

## 2017-01-27 NOTE — Op Note (Signed)
OPERATIVE REPORT-TOTAL KNEE ARTHROPLASTY   Pre-operative diagnosis- Osteoarthritis  Right knee(s)  Post-operative diagnosis- Osteoarthritis Right knee(s)  Procedure-  Right  Total Knee Arthroplasty  Surgeon- Dione Plover. Lyris Hitchman, MD  Assistant- Ardeen Jourdain, PA-C   Anesthesia-  Adductor canal block and spinal  EBL-* No blood loss amount entered *   Drains Hemovac  Tourniquet time-  Total Tourniquet Time Documented: Thigh (Right) - 33 minutes Total: Thigh (Right) - 33 minutes     Complications- None  Condition-PACU - hemodynamically stable.   Brief Clinical Note  Adam Daniel is a 71 y.o. year old male with end stage OA of his right knee with progressively worsening pain and dysfunction. He has constant pain, with activity and at rest and significant functional deficits with difficulties even with ADLs. He has had extensive non-op management including analgesics, injections of cortisone and viscosupplements, and home exercise program, but remains in significant pain with significant dysfunction. Radiographs show bone on bone arthritis medial and patellofemoral. He presents now for right Total Knee Arthroplasty.    Procedure in detail---   The patient is brought into the operating room and positioned supine on the operating table. After successful administration of  Adductor canal block and spinal,   a tourniquet is placed high on the  Right thigh(s) and the lower extremity is prepped and draped in the usual sterile fashion. Time out is performed by the operating team and then the  Right lower extremity is wrapped in Esmarch, knee flexed and the tourniquet inflated to 300 mmHg.       A midline incision is made with a ten blade through the subcutaneous tissue to the level of the extensor mechanism. A fresh blade is used to make a medial parapatellar arthrotomy. Soft tissue over the proximal medial tibia is subperiosteally elevated to the joint line with a knife and into the  semimembranosus bursa with a Cobb elevator. Soft tissue over the proximal lateral tibia is elevated with attention being paid to avoiding the patellar tendon on the tibial tubercle. The patella is everted, knee flexed 90 degrees and the ACL and PCL are removed. Findings are bone on bone medial and patellofemoral with large global ostoephytes.        The drill is used to create a starting hole in the distal femur and the canal is thoroughly irrigated with sterile saline to remove the fatty contents. The 5 degree Right  valgus alignment guide is placed into the femoral canal and the distal femoral cutting block is pinned to remove 10 mm off the distal femur. Resection is made with an oscillating saw.      The tibia is subluxed forward and the menisci are removed. The extramedullary alignment guide is placed referencing proximally at the medial aspect of the tibial tubercle and distally along the second metatarsal axis and tibial crest. The block is pinned to remove 94mm off the more deficient medial  side. Resection is made with an oscillating saw. Size 4is the most appropriate size for the tibia and the proximal tibia is prepared with the modular drill and keel punch for that size.      The femoral sizing guide is placed and size 4 is most appropriate. Rotation is marked off the epicondylar axis and confirmed by creating a rectangular flexion gap at 90 degrees. The size 4 cutting block is pinned in this rotation and the anterior, posterior and chamfer cuts are made with the oscillating saw. The intercondylar block is then placed and  that cut is made.      Trial size 4 tibial component, trial size 4 posterior stabilized femur and a 15  mm posterior stabilized rotating platform insert trial is placed. Full extension is achieved with excellent varus/valgus and anterior/posterior balance throughout full range of motion. The patella is everted and thickness measured to be 24  mm. Free hand resection is taken to 14 mm,  a 38 template is placed, lug holes are drilled, trial patella is placed, and it tracks normally. Osteophytes are removed off the posterior femur with the trial in place. All trials are removed and the cut bone surfaces prepared with pulsatile lavage. Cement is mixed and once ready for implantation, the size 4 tibial implant, size  4 posterior stabilized femoral component, and the size 38 patella are cemented in place and the patella is held with the clamp. The trial insert is placed and the knee held in full extension. The Exparel (20 ml mixed with 60 ml saline) is injected into the extensor mechanism, posterior capsule, medial and lateral gutters and subcutaneous tissues.  All extruded cement is removed and once the cement is hard the permanent 15 mm posterior stabilized rotating platform insert is placed into the tibial tray.      The wound is copiously irrigated with saline solution and the extensor mechanism closed over a hemovac drain with #1 V-loc suture. The tourniquet is released for a total tourniquet time of 33  minutes. Flexion against gravity is 140 degrees and the patella tracks normally. Subcutaneous tissue is closed with 2.0 vicryl and subcuticular with running 4.0 Monocryl. The incision is cleaned and dried and steri-strips and a bulky sterile dressing are applied. The limb is placed into a knee immobilizer and the patient is awakened and transported to recovery in stable condition.      Please note that a surgical assistant was a medical necessity for this procedure in order to perform it in a safe and expeditious manner. Surgical assistant was necessary to retract the ligaments and vital neurovascular structures to prevent injury to them and also necessary for proper positioning of the limb to allow for anatomic placement of the prosthesis.   Dione Plover Jerman Tinnon, MD    01/27/2017, 8:19 AM

## 2017-01-27 NOTE — Anesthesia Procedure Notes (Signed)
Spinal  Patient location during procedure: OR Start time: 01/27/2017 7:17 AM End time: 01/27/2017 7:23 AM Reason for block: at surgeon's request Staffing Resident/CRNA: Anne Fu Performed: resident/CRNA  Preanesthetic Checklist Completed: patient identified, site marked, surgical consent, pre-op evaluation, timeout performed, IV checked, risks and benefits discussed and monitors and equipment checked Spinal Block Patient position: sitting Prep: DuraPrep Patient monitoring: heart rate, continuous pulse ox and blood pressure Approach: right paramedian Location: L2-3 Injection technique: single-shot Needle Needle type: Pencan  Needle gauge: 24 G Needle length: 9 cm Assessment Sensory level: T6 Additional Notes Expiration date of kit checked and confirmed. Patient tolerated procedure well, without complications. X 1 attempt with noted clear CSF return. Loss of motor and sensory on exam post injection.

## 2017-01-27 NOTE — Anesthesia Preprocedure Evaluation (Signed)
Anesthesia Evaluation  Patient identified by MRN, date of birth, ID band Patient awake    Reviewed: Allergy & Precautions, NPO status , Patient's Chart, lab work & pertinent test results  Airway Mallampati: II  TM Distance: >3 FB Neck ROM: Full    Dental no notable dental hx.    Pulmonary sleep apnea ,    Pulmonary exam normal breath sounds clear to auscultation       Cardiovascular hypertension, Pt. on medications negative cardio ROS Normal cardiovascular exam Rhythm:Regular Rate:Normal     Neuro/Psych negative neurological ROS  negative psych ROS   GI/Hepatic negative GI ROS, Neg liver ROS,   Endo/Other  negative endocrine ROS  Renal/GU Renal InsufficiencyRenal diseasenegative Renal ROS     Musculoskeletal  (+) Arthritis ,   Abdominal   Peds  Hematology negative hematology ROS (+)   Anesthesia Other Findings   Reproductive/Obstetrics negative OB ROS                             Anesthesia Physical  Anesthesia Plan  ASA: II  Anesthesia Plan: Spinal   Post-op Pain Management:  Regional for Post-op pain   Induction: Intravenous  PONV Risk Score and Plan: 1 and Ondansetron and Treatment may vary due to age or medical condition  Airway Management Planned: Simple Face Mask  Additional Equipment:   Intra-op Plan:   Post-operative Plan:   Informed Consent: I have reviewed the patients History and Physical, chart, labs and discussed the procedure including the risks, benefits and alternatives for the proposed anesthesia with the patient or authorized representative who has indicated his/her understanding and acceptance.   Dental advisory given  Plan Discussed with: CRNA  Anesthesia Plan Comments:         Anesthesia Quick Evaluation

## 2017-01-27 NOTE — H&P (View-Only) (Signed)
Adam Daniel DOB: 12-23-1945 Married / Language: English / Race: White Male Date of Admission:  01/27/2017 CC:  Right knee pain History of Present Illness The patient is a 71 year old male who comes in for a preoperative History and Physical. The patient is scheduled for a right total knee arthroplasty to be performed by Dr. Dione Plover. Aluisio, MD at [facility] on 01/27/2017. The patient is being followed for their right knee pain and osteoarthritis. They are now months out from aspiration with cortisone injection and also post Monovisc. Symptoms reported include: pain, aching, instability and difficulty ambulating. The patient feels that they are doing poorly. Current treatment includes: home exercise program (mainly walking), bracing (copper knee sleeve), activity modification and icing at night. The patient has reported symptom improvement with: Cortisone injections while they have not gotten any relief of their symptoms with: viscosupplementation. Unfortuntely, the Monovisc made his knee pain worse. Adam Daniel is well known to the practice for endstage OA in the medial compartment of the right knee. He continues to have activity related pain and swelling especially after yard work or anything strenuous. He wears a copper fit brace which does help some of the pain but will still swell. He has noticed these flares have been increasing in intensity ove the past year or two. He has undergone aspirations in the past and cortisone injection with temporary relief. He recently received the Monovisc gel injection which unfortunately made his knee worse temporarily. He tries to remain active but the knee continues to flare up. He would like to get back to playing golf however he does not trust the knee. It has not buckled or locked but is does pop and grind. He has reached a point where he would like to proceed with surgical intervention at this time. They have been treated  conservatively in the past for the above stated problem and despite conservative measures, they continue to have progressive pain and severe functional limitations and dysfunction. They have failed non-operative management including home exercise, medications, and injections. It is felt that they would benefit from undergoing total joint replacement. Risks and benefits of the procedure have been discussed with the patient and they elect to proceed with surgery. There are no active contraindications to surgery such as ongoing infection or rapidly progressive neurological disease.   Problem List/Past Medical Knee Pain (M25.569)  High blood pressure  Hypercholesterolemia  Skin Cancer  Melanoma Primary osteoarthritis of right knee (M17.11)  Impaired Memory  Tinnitus  Shingles  Sleep Apnea  uses CPAP Hemorrhoids  Colon Cancer  Post-Colectomy Measles  Mumps  Plaque Psoriasis   Allergies  Augmentin *PENICILLINS*  rash Simvastatin *ANTIHYPERLIPIDEMICS*  Monopril *ANTIHYPERTENSIVES*   Family History  Diabetes Mellitus  mother, father, sister and brother Drug / Alcohol Addiction  grandfather fathers side Hypertension  mother, father and brother Cancer  grandfather mothers side Congestive Heart Failure  mother, father and sister Depression  grandmother fathers side Father  Deceased. age 58; Heart Failure, Diabetes Mother  Deceased. age 7; Heart Failure, Diabetes  Social History  Number of flights of stairs before winded  greater than 5 Marital status  married Living situation  live with spouse Tobacco use  never smoker Tobacco / smoke exposure  yes outdoors only Pain Contract  no Illicit drug use  no Current work status  retired Children  2 Alcohol use  current drinker; drinks beer, wine and hard liquor; less than 5 per week Exercise  Light, daily, 15 -  20 minutes, running/walking. 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 Catering manager.  Medication History Garlic (100MG  Tablet, Oral) Active. Cinnamon (500MG  Capsule, Oral) Active. Vitamin D (1 (one) Oral) Specific strength unknown - Active. Loratadine (10MG  Tablet, Oral) Active. Levitra (10MG  Tablet, Oral) Active. Aspirin (81MG  Tablet, 1 (one) Oral) Active. Centrum Silver (Oral) Active. Glucosamine Complex (1 (one) Oral) Active. Co Q10 (Oral) Specific strength unknown - Active. Fluticasone Propionate (50MCG/ACT Suspension, Nasal) Active. Citalopram Hydrobromide (20MG  Tablet, Oral) Active. Losartan Potassium (100MG  Tablet, Oral) Active. AmLODIPine Besylate (10MG  Tablet, Oral) Active. Crestor (5MG  Tablet, Oral) Active.  Past Surgical History  No pertinent past surgical history    Review of Systems  General Not Present- Chills, Fatigue, Fever, Memory Loss, Night Sweats, Weight Gain and Weight Loss. Skin Present- Psoriasis. Not Present- Eczema, Hives, Itching, Lesions and Rash. HEENT Present- Tinnitus. Not Present- Dentures, Double Vision, Headache, Hearing Loss and Visual Loss. Respiratory Present- Allergies and Snoring. Not Present- Chronic Cough, Coughing up blood, Shortness of breath at rest and Shortness of breath with exertion. Cardiovascular Present- Slow Heart Rate (Resting rate is typically around 52). Not Present- Chest Pain, Difficulty Breathing Lying Down, Murmur, Palpitations, Racing/skipping heartbeats and Swelling. Gastrointestinal Present- Hemorrhoids. Not Present- Abdominal Pain, Bloody Stool, Constipation, Diarrhea, Difficulty Swallowing, Heartburn, Jaundice, Loss of appetitie, Nausea and Vomiting. Male Genitourinary Not Present- Blood in Urine, Discharge, Flank Pain, Incontinence, Painful Urination, Urgency, Urinary  frequency, Urinary Retention, Urinating at Night and Weak urinary stream. Musculoskeletal Present- Decreased Range of Motion and Joint Pain. Not Present- Back Pain, Joint Swelling, Morning Stiffness, Muscle Pain, Muscle Weakness and Spasms. Neurological Not Present- Blackout spells, Difficulty with balance, Dizziness, Paralysis, Tremor and Weakness. Psychiatric Not Present- Insomnia. Endocrine Present- Hair Changes. Hematology Present- Easy Bruising.  Vitals Weight: 182 lb Height: 69in Weight was reported by patient. Height was reported by patient. Body Surface Area: 1.98 m Body Mass Index: 26.88 kg/m  Pulse: 68 (Regular)  BP: 114/64 (Sitting, Right Arm, Standard)   Physical Exam  General Mental Status -Alert, cooperative and good historian. General Appearance-pleasant, Not in acute distress. Orientation-Oriented X3. Build & Nutrition-Well nourished and Well developed.  Head and Neck Head-normocephalic, atraumatic . Neck Global Assessment - supple, no bruit auscultated on the right, no bruit auscultated on the left.  Eye Pupil - Bilateral-Regular and Round. Motion - Bilateral-EOMI.  Chest and Lung Exam Auscultation Breath sounds - clear at anterior chest wall and clear at posterior chest wall. Adventitious sounds - No Adventitious sounds.  Cardiovascular Auscultation Rhythm - Regular rate and rhythm. Heart Sounds - S1 WNL and S2 WNL. Murmurs & Other Heart Sounds - Auscultation of the heart reveals - No Murmurs.  Abdomen Palpation/Percussion Tenderness - Abdomen is non-tender to palpation. Rigidity (guarding) - Abdomen is soft. Auscultation Auscultation of the abdomen reveals - Bowel sounds normal.  Male Genitourinary Note: Not done, not pertinent to present illness   Musculoskeletal Note: Well-developed male alert and oriented in no apparent distress. Left knee shows no effusion. Range of motion 0-125. No medial or lateral joint line  tenderness. No instability. No crepitus on range of motion. his RIGHT knee shows no effusion. Range of motion 5-125. Moderate crepitus on range of motion. He is tender medial greater than lateral with no instability noted. Pulses sensation and motor intact both lower extremities. He has an antalgic  gait pattern on the RIGHT.  Radiographs AP and lateral of the RIGHT knee showed he has bone-on-bone change in medial compartment with some tibial subluxation and significant patellofemoral spurring.   Assessment & Plan Primary osteoarthritis of one knee, right (M17.11)  Note:Surgical Plans: Right Total Knee Replacement  Disposition: Home with family  PCP: Dr. Felipa Eth - Patient has been seen preoperatively and felt to be stable for surgery.  Topical TXA - Colon Cancer  Anesthesia Issues: Slight cognitive dysfunction following last gerneral anesthesia  Patient was instructed on what medications to stop prior to surgery.  Signed electronically by Joelene Millin, III PA-C

## 2017-01-27 NOTE — Evaluation (Signed)
Physical Therapy Evaluation Patient Details Name: Adam Daniel MRN: [id number] DOB: 1945-10-14 Today's Date: 01/27/2017   History of Present Illness  R tka  Clinical Impression  The patient is progressing  Well. Plans  Home with OPPT. Pt admitted with above diagnosis. Pt currently with functional limitations due to the deficits listed below (see PT Problem List).  Pt will benefit from skilled PT to increase their independence and safety with mobility to allow discharge to the venue listed below.       Follow Up Recommendations Outpatient PT;DC plan and follow up therapy as arranged by surgeon    Equipment Recommendations  None recommended by PT    Recommendations for Other Services       Precautions / Restrictions Precautions Precautions: Fall;Knee Required Braces or Orthoses: Knee Immobilizer - Left Knee Immobilizer - Left: Discontinue once straight leg raise with < 10 degree lag      Mobility  Bed Mobility Overal bed mobility: Needs Assistance Bed Mobility: Supine to Sit     Supine to sit: Min guard     General bed mobility comments: cues for technique  Transfers Overall transfer level: Needs assistance Equipment used: Rolling walker (2 wheeled) Transfers: Sit to/from Stand Sit to Stand: Min assist         General transfer comment: cues for ahnd and right  leg position  Ambulation/Gait Ambulation/Gait assistance: Min assist Ambulation Distance (Feet): 90 Feet Assistive device: Rolling walker (2 wheeled) Gait Pattern/deviations: Step-to pattern;Step-through pattern     General Gait Details: cues for sequence   Stairs            Wheelchair Mobility    Modified Rankin (Stroke Patients Only)       Balance                                             Pertinent Vitals/Pain Pain Assessment: 0-10 Pain Score: 2  Pain Location:  right knee Pain Descriptors / Indicators: Discomfort;Sore Pain Intervention(s):  Premedicated before session;Repositioned;Ice applied;Monitored during session    [place] expects to be discharged to:: Private residence Living Arrangements: Spouse/significant other;Children Available Help at Discharge: Family Type of Home: House Home Access: Stairs to enter Entrance Stairs-Rails: None Entrance Stairs-Number of Steps: 2 Home Layout: One level Home Equipment: Environmental consultant - 2 wheels;Cane - single point;Grab bars - tub/shower      Prior Function Level of Independence: Independent               Hand Dominance        Extremity/Trunk Assessment   Upper Extremity Assessment Upper Extremity Assessment: Defer to OT evaluation    Lower Extremity Assessment Lower Extremity Assessment: RLE deficits/detail RLE Deficits / Details: + SLR    Cervical / Trunk Assessment Cervical / Trunk Assessment: Normal  Communication   Communication: No difficulties  Cognition Arousal/Alertness: Awake/alert Behavior During Therapy: WFL for tasks assessed/performed Overall Cognitive Status: Within Functional Limits for tasks assessed                                        General Comments      Exercises     Assessment/Plan    PT Assessment Patient needs continued PT services  PT Problem List Decreased strength;Decreased range of motion;Decreased activity  tolerance;Decreased mobility;Decreased knowledge of precautions;Decreased safety awareness;Decreased knowledge of use of DME;Pain       PT Treatment Interventions DME instruction;Gait training;Stair training;Functional mobility training;Therapeutic activities;Therapeutic exercise;Patient/family education    PT Goals (Current goals can be found in the Care Plan section)  Acute Rehab PT Goals Patient Stated Goal: to go home PT Goal Formulation: With patient/family Time For Goal Achievement: 02/01/17 Potential to Achieve Goals: Good    Frequency 7X/week   Barriers to discharge         Co-evaluation               AM-PAC PT "6 Clicks" Daily Activity  Outcome Measure Difficulty turning over in bed (including adjusting bedclothes, sheets and blankets)?: A Little Difficulty moving from lying on back to sitting on the side of the bed? : A Little Difficulty sitting down on and standing up from a chair with arms (e.g., wheelchair, bedside commode, etc,.)?: Unable Help needed moving to and from a bed to chair (including a wheelchair)?: Total Help needed walking in hospital room?: Total Help needed climbing 3-5 steps with a railing? : Total 6 Click Score: 10    End of Session Equipment Utilized During Treatment: Gait belt;Right knee immobilizer Activity Tolerance: Patient tolerated treatment well Patient left: in chair;with call bell/phone within reach Nurse Communication: Mobility status PT Visit Diagnosis: Pain;Unsteadiness on feet (R26.81) Pain - Right/Left: Right Pain - part of body: Knee    Time: 1500-1535 PT Time Calculation (min) (ACUTE ONLY): 35 min   Charges:   PT Evaluation $PT Eval Low Complexity: 1 Low PT Treatments $Gait Training: 8-22 mins   PT G Codes:        {Dezarai Prew PT [phone]   Claretha Cooper 01/27/2017, 4:25 PM

## 2017-01-27 NOTE — Progress Notes (Signed)
Orthopedic Tech Progress Note Patient Details:  Adam Daniel Nov 04, 1945 [id number]  CPM Right Knee CPM Right Knee: On Right Knee Flexion (Degrees): 40 Right Knee Extension (Degrees): 10   [name] 01/27/2017, 10:21 AM

## 2017-01-27 NOTE — Care Management Note (Signed)
Case Management Note  Patient Details  Name: Adam Daniel MRN: [id number] Date of Birth: 08-29-1945  Subjective/Objective:  71 y/o m admitted w/OA L knee. From home w/spouse. Has rw,cane,toilet riser. Patient already scheduled for otpt PT. PT cons.Noted on 02-will monitor if needed @ d/c.                  Action/Plan:d/c plan home.   Expected Discharge Date:                  Expected Discharge Plan:  OP Rehab  In-House Referral:     Discharge planning Services  CM Consult  Post Acute Care Choice:  Durable Medical Equipment (cane, rw,toiler riser) Choice offered to:     DME Arranged:    DME Agency:     HH Arranged:    HH Agency:     Status of Service:  In process, will continue to follow  If discussed at Long Length of Stay Meetings, dates discussed:    Additional Comments:  Dessa Phi, RN 01/27/2017, 4:34 PM

## 2017-01-27 NOTE — Interval H&P Note (Signed)
History and Physical Interval Note:  01/27/2017 6:53 AM  Adam Daniel  has presented today for surgery, with the diagnosis of Osteoarthritis Right Knee  The various methods of treatment have been discussed with the patient and family. After consideration of risks, benefits and other options for treatment, the patient has consented to  Procedure(s): RIGHT TOTAL KNEE ARTHROPLASTY (Right) as a surgical intervention .  The patient's history has been reviewed, patient examined, no change in status, stable for surgery.  I have reviewed the patient's chart and labs.  Questions were answered to the patient's satisfaction.     [name]

## 2017-01-27 NOTE — Anesthesia Postprocedure Evaluation (Signed)
Anesthesia Post Note  Patient: Adam Daniel  Procedure(s) Performed: RIGHT TOTAL KNEE ARTHROPLASTY (Right Knee)     Patient location during evaluation: PACU Anesthesia Type: Spinal Level of consciousness: oriented and awake and alert Pain management: pain level controlled Vital Signs Assessment: post-procedure vital signs reviewed and stable Respiratory status: spontaneous breathing and respiratory function stable Cardiovascular status: blood pressure returned to baseline and stable Postop Assessment: no headache, no backache and no apparent nausea or vomiting Anesthetic complications: no    Last Vitals:  Vitals:   01/27/17 0945 01/27/17 1004  BP: 114/67 112/64  Pulse: 68 67  Resp: (!) 21 16  Temp: 36.4 C 36.4 C  SpO2: 99% 96%    Last Pain:  Vitals:   01/27/17 1004  TempSrc:   PainSc: 0-No pain                 [name]

## 2017-01-27 NOTE — Progress Notes (Signed)
22Orthopedic Tech Progress Note Patient DetailsZYIERE Daniel [birth year] [id number]  CPM Right Knee CPM Right Knee: Off Right Knee Flexion (Degrees): 40 Right Knee Extension (Degrees): 10   [name] 01/27/2017, 2:15 PM

## 2017-01-27 NOTE — Anesthesia Procedure Notes (Signed)
Anesthesia Regional Block: Adductor canal block   Pre-Anesthetic Checklist: ,, timeout performed, Correct Patient, Correct Site, Correct Laterality, Correct Procedure, Correct Position, site marked, Risks and benefits discussed,  Surgical consent,  Pre-op evaluation,  At surgeon's request and post-op pain management  Laterality: Right  Prep: chloraprep       Needles:  Injection technique: Single-shot  Needle Type: Stimiplex     Needle Length: 9cm  Needle Gauge: 21     Additional Needles:   Procedures:,,,, ultrasound used (permanent image in chart),,,,  Narrative:  Start time: 01/27/2017 6:55 AM End time: 01/27/2017 7:00 AM Injection made incrementally with aspirations every 5 mL.  Performed by: Personally  Anesthesiologist: Candida Peeling RAY

## 2017-01-27 NOTE — Progress Notes (Signed)
Pt. helped with set up of home CPAP along with RN, SW added to humidifier with Oxygen connecter/line placed, made aware to notify if anything needed, RT to monitor.

## 2017-01-27 NOTE — Transfer of Care (Signed)
Immediate Anesthesia Transfer of Care Note  Patient: Adam Daniel  Procedure(s) Performed: Procedure(s): RIGHT TOTAL KNEE ARTHROPLASTY (Right)  Patient Location: PACU  Anesthesia Type:Spinal  Level of Consciousness:  sedated, patient cooperative and responds to stimulation  Airway & Oxygen Therapy:Patient Spontanous Breathing and Patient connected to face mask oxgen  Post-op Assessment:  Report given to PACU RN and Post -op Vital signs reviewed and stable  Post vital signs:  Reviewed and stable  Last Vitals:  Vitals:   01/27/17 0550  BP: 133/79  Pulse: (!) 58  Resp: 16  Temp: 37 C  SpO2: 50%    Complications: No apparent anesthesia complications, N-18 level on release to PACU staff.

## 2017-01-28 ENCOUNTER — Encounter (HOSPITAL_COMMUNITY): Payer: Self-pay | Admitting: Orthopedic Surgery

## 2017-01-28 LAB — BASIC METABOLIC PANEL
Anion gap: 10 (ref 5–15)
BUN: 21 mg/dL — AB (ref 6–20)
CO2: 22 mmol/L (ref 22–32)
CREATININE: 1.4 mg/dL — AB (ref 0.61–1.24)
Calcium: 8.4 mg/dL — ABNORMAL LOW (ref 8.9–10.3)
Chloride: 103 mmol/L (ref 101–111)
GFR, EST AFRICAN AMERICAN: 56 mL/min — AB (ref >60)
GFR, EST NON AFRICAN AMERICAN: 48 mL/min — AB (ref >60)
Glucose, Bld: 142 mg/dL — ABNORMAL HIGH (ref 65–99)
Potassium: 4.5 mmol/L (ref 3.5–5.1)
SODIUM: 135 mmol/L (ref 135–145)

## 2017-01-28 LAB — CBC
HCT: 35.8 % — ABNORMAL LOW (ref 39.0–52.0)
HEMOGLOBIN: 12.2 g/dL — AB (ref 13.0–17.0)
MCH: 31 pg (ref 26.0–34.0)
MCHC: 34.1 g/dL (ref 30.0–36.0)
MCV: 91.1 fL (ref 78.0–100.0)
Platelets: 242 10*3/uL (ref 150–400)
RBC: 3.93 MIL/uL — AB (ref 4.22–5.81)
RDW: 13.2 % (ref 11.5–15.5)
WBC: 13.2 10*3/uL — ABNORMAL HIGH (ref 4.0–10.5)

## 2017-01-28 MED ORDER — RIVAROXABAN 10 MG PO TABS: 10.0000 mg | ORAL_TABLET | Freq: Every day | ORAL | 0 refills | Status: DC

## 2017-01-28 MED ORDER — METHOCARBAMOL 500 MG PO TABS: 500.0000 mg | ORAL_TABLET | Freq: Four times a day (QID) | ORAL | 0 refills | Status: DC | PRN

## 2017-01-28 MED ORDER — OXYCODONE HCL 5 MG PO TABS: 5.0000 mg | ORAL_TABLET | ORAL | 0 refills | Status: DC | PRN

## 2017-01-28 NOTE — Progress Notes (Signed)
Pt. rounded on from initial use on 10/8, no c/o aware to notify if needed, family member remaining at bedside this evening.

## 2017-01-28 NOTE — Progress Notes (Signed)
Orthopedic Tech Progress Note Patient Details:  Adam Daniel 1945-11-07 [id number]  CPM Right Knee CPM Right Knee: On Right Knee Flexion (Degrees): 40 Right Knee Extension (Degrees): 10   [name] 01/28/2017, 2:47 PM

## 2017-01-28 NOTE — Evaluation (Signed)
Occupational Therapy Evaluation Patient Details Name: Adam Daniel MRN: [id number] DOB: 1945/08/05 Today's Date: 01/28/2017    History of Present Illness R tka   Clinical Impression   This 71 year old man was admitted for the above sx.  He will benefit from one more session of OT to increase safety and independence with toilet transfers.  Goal is for supervision level    Follow Up Recommendations  Supervision/Assistance - 24 hour    Equipment Recommendations  None recommended by OT    Recommendations for Other Services       Precautions / Restrictions Precautions Precautions: Fall;Knee Required Braces or Orthoses: Knee Immobilizer - Left Knee Immobilizer - Left: Discontinue once straight leg raise with < 10 degree lag Restrictions Weight Bearing Restrictions: No      Mobility Bed Mobility         Supine to sit: Min assist     General bed mobility comments: light min A for LLE  Transfers   Equipment used: Rolling walker (2 wheeled)   Sit to Stand: Min guard         General transfer comment: cues for UE/LE placementg    Balance                                           ADL either performed or assessed with clinical judgement   ADL Overall ADL's : Needs assistance/impaired Eating/Feeding: Independent   Grooming: Oral care;Supervision/safety;Standing   Upper Body Bathing: Set up;Sitting   Lower Body Bathing: Minimal assistance;Sit to/from stand   Upper Body Dressing : Set up;Sitting   Lower Body Dressing: Moderate assistance;Sit to/from stand   Toilet Transfer: Min guard;Ambulation;RW (chair)   Toileting- Clothing Manipulation and Hygiene: Min guard;Sit to/from stand         General ADL Comments: educated on tub readiness.  Educated on not placing as much weight through arms and pushing walker vs lifting it as he was c/o arms getting tired.  Ambulated to bathroom and brushed teeth.  Called for NT to bring 3:1 to room  as pt has urgency at times. Educated on sidestepping through tight spaces at home; pt verbalizes understanding.  Pt states he was able to lift leg more yesterday; explained that this is normal. Educated on use of KI and used when walking to bathroom. Cues given for sequence and not lifting walker from floor     Vision         Perception     Praxis      Pertinent Vitals/Pain Pain Score: 2  Pain Location:  right knee Pain Descriptors / Indicators: Discomfort;Sore Pain Intervention(s): Limited activity within patient's tolerance;Monitored during session;Premedicated before session;Repositioned;Ice applied     Hand Dominance     Extremity/Trunk Assessment Upper Extremity Assessment Upper Extremity Assessment: Overall WFL for tasks assessed           Communication Communication Communication: No difficulties   Cognition Arousal/Alertness: Awake/alert Behavior During Therapy: WFL for tasks assessed/performed Overall Cognitive Status: Within Functional Limits for tasks assessed                                     General Comments       Exercises     Shoulder Instructions      Home Living Family/patient expects to  be discharged to:: Private residence Living Arrangements: Spouse/significant other;Children Available Help at Discharge: Family               Bathroom Shower/Tub: Teaching laboratory technician Toilet: Handicapped height     Home Equipment: Toilet riser;Grab bars - tub/shower;Shower seat          Prior Functioning/Environment Level of Independence: Independent                 OT Problem List: Decreased activity tolerance;Pain;Decreased knowledge of use of DME or AE      OT Treatment/Interventions: Self-care/ADL training;DME and/or AE instruction;Patient/family education    OT Goals(Current goals can be found in the care plan section) Acute Rehab OT Goals Patient Stated Goal: to go home OT Goal Formulation: With  patient Time For Goal Achievement: 02/01/17 Potential to Achieve Goals: Good ADL Goals Pt Will Transfer to Toilet: with supervision;ambulating;bedside commode Additional ADL Goal #1: pt not need any cues for safety/sequencing when walking to bathroom  OT Frequency: Min 2X/week   Barriers to D/C:            Co-evaluation              AM-PAC PT "6 Clicks" Daily Activity     Outcome Measure Help from another person eating meals?: None Help from another person taking care of personal grooming?: A Little Help from another person toileting, which includes using toliet, bedpan, or urinal?: A Little Help from another person bathing (including washing, rinsing, drying)?: A Little Help from another person to put on and taking off regular upper body clothing?: A Little Help from another person to put on and taking off regular lower body clothing?: A Lot 6 Click Score: 18   End of Session    Activity Tolerance: Patient tolerated treatment well Patient left: in chair;with call bell/phone within reach;with family/visitor present  OT Visit Diagnosis: Pain Pain - Right/Left: Left Pain - part of body: Knee                Time: 7619-5093 OT Time Calculation (min): 40 min Charges:  OT General Charges $OT Visit: 1 Visit OT Evaluation $OT Eval Low Complexity: 1 Low OT Treatments $Self Care/Home Management : 8-22 mins $Therapeutic Activity: 8-22 mins G-Codes:     Cash, OTR/L [phone] 01/28/2017  Oralee Rapaport 01/28/2017, 9:56 AM

## 2017-01-28 NOTE — Discharge Instructions (Addendum)
° °Dr. Frank Aluisio °Total Joint Specialist °[facility] °[address] °[place], [place] [address] °[phone] ° °TOTAL KNEE REPLACEMENT POSTOPERATIVE DIRECTIONS ° °Knee Rehabilitation, Guidelines Following Surgery  °Results after knee surgery are often greatly improved when you follow the exercise, range of motion and muscle strengthening exercises prescribed by your doctor. Safety measures are also important to protect the knee from further injury. Any time any of these exercises cause you to have increased pain or swelling in your knee joint, decrease the amount until you are comfortable again and slowly increase them. If you have problems or questions, call your caregiver or physical therapist for advice.  ° °HOME CARE INSTRUCTIONS  °Remove items at home which could result in a fall. This includes throw rugs or furniture in walking pathways.  °· ICE to the affected knee every three hours for 30 minutes at a time and then as needed for pain and swelling.  Continue to use ice on the knee for pain and swelling from surgery. You may notice swelling that will progress down to the foot and ankle.  This is normal after surgery.  Elevate the leg when you are not up walking on it.   °· Continue to use the breathing machine which will help keep your temperature down.  It is common for your temperature to cycle up and down following surgery, especially at night when you are not up moving around and exerting yourself.  The breathing machine keeps your lungs expanded and your temperature down. °· Do not place pillow under knee, focus on keeping the knee straight while resting ° °DIET °You may resume your previous home diet once your are discharged from the hospital. ° °DRESSING / WOUND CARE / SHOWERING °You may shower 3 days after surgery, but keep the wounds dry during showering.  You may use an occlusive plastic wrap (Press'n Seal for example), NO SOAKING/SUBMERGING IN THE BATHTUB.  If the  bandage gets wet, change with a clean dry gauze.  If the incision gets wet, pat the wound dry with a clean towel. °You may start showering once you are discharged home but do not submerge the incision under water. Just pat the incision dry and apply a dry gauze dressing on daily. °Change the surgical dressing daily and reapply a dry dressing each time. ° °ACTIVITY °Walk with your walker as instructed. °Use walker as long as suggested by your caregivers. °Avoid periods of inactivity such as sitting longer than an hour when not asleep. This helps prevent blood clots.  °You may resume a sexual relationship in one month or when given the OK by your doctor.  °You may return to work once you are cleared by your doctor.  °Do not drive a car for 6 weeks or until released by you surgeon.  °Do not drive while taking narcotics. ° °WEIGHT BEARING °Weight bearing as tolerated with assist device (walker, cane, etc) as directed, use it as long as suggested by your surgeon or therapist, typically at least 4-6 weeks. ° °POSTOPERATIVE CONSTIPATION PROTOCOL °Constipation - defined medically as fewer than three stools per week and severe constipation as less than one stool per week. ° °One of the most common issues patients have following surgery is constipation.  Even if you have a regular bowel pattern at home, your normal regimen is likely to be disrupted due to multiple reasons following surgery.  Combination of anesthesia, postoperative narcotics, change in appetite and fluid intake all can affect your bowels.    In order to avoid complications following surgery, here are some recommendations in order to help you during your recovery period. ° °Colace (docusate) - Pick up an over-the-counter form of Colace or another stool softener and take twice a day as long as you are requiring postoperative pain medications.  Take with a full glass of water daily.  If you experience loose stools or diarrhea, hold the colace until you stool forms  back up.  If your symptoms do not get better within 1 week or if they get worse, check with your doctor. ° °Dulcolax (bisacodyl) - Pick up over-the-counter and take as directed by the product packaging as needed to assist with the movement of your bowels.  Take with a full glass of water.  Use this product as needed if not relieved by Colace only.  ° °MiraLax (polyethylene glycol) - Pick up over-the-counter to have on hand.  MiraLax is a solution that will increase the amount of water in your bowels to assist with bowel movements.  Take as directed and can mix with a glass of water, juice, soda, coffee, or tea.  Take if you go more than two days without a movement. °Do not use MiraLax more than once per day. Call your doctor if you are still constipated or irregular after using this medication for 7 days in a row. ° °If you continue to have problems with postoperative constipation, please contact the office for further assistance and recommendations.  If you experience "the worst abdominal pain ever" or develop nausea or vomiting, please contact the office immediatly for further recommendations for treatment. ° °ITCHING ° If you experience itching with your medications, try taking only a single pain pill, or even half a pain pill at a time.  You can also use Benadryl over the counter for itching or also to help with sleep.  ° °TED HOSE STOCKINGS °Wear the elastic stockings on both legs for three weeks following surgery during the day but you may remove then at night for sleeping. ° °MEDICATIONS °See your medication summary on the “After Visit Summary” that the nursing staff will review with you prior to discharge.  You may have some home medications which will be placed on hold until you complete the course of blood thinner medication.  It is important for you to complete the blood thinner medication as prescribed by your surgeon.  Continue your approved medications as instructed at time of  discharge. ° °PRECAUTIONS °If you experience chest pain or shortness of breath - call 911 immediately for transfer to the hospital emergency department.  °If you develop a fever greater that 101 F, purulent drainage from wound, increased redness or drainage from wound, foul odor from the wound/dressing, or calf pain - CONTACT YOUR SURGEON.   °                                                °FOLLOW-UP APPOINTMENTS °Make sure you keep all of your appointments after your operation with your surgeon and caregivers. You should call the office at the above phone number and make an appointment for approximately two weeks after the date of your surgery or on the date instructed by your surgeon outlined in the "After Visit Summary". ° ° °RANGE OF MOTION AND STRENGTHENING EXERCISES  °Rehabilitation of the knee is important following a knee injury or   an operation. After just a few days of immobilization, the muscles of the thigh which control the knee become weakened and shrink (atrophy). Knee exercises are designed to build up the tone and strength of the thigh muscles and to improve knee motion. Often times heat used for twenty to thirty minutes before working out will loosen up your tissues and help with improving the range of motion but do not use heat for the first two weeks following surgery. These exercises can be done on a training (exercise) mat, on the floor, on a table or on a bed. Use what ever works the best and is most comfortable for you Knee exercises include:  °Leg Lifts - While your knee is still immobilized in a splint or cast, you can do straight leg raises. Lift the leg to 60 degrees, hold for 3 sec, and slowly lower the leg. Repeat 10-20 times 2-3 times daily. Perform this exercise against resistance later as your knee gets better.  °Quad and Hamstring Sets - Tighten up the muscle on the front of the thigh (Quad) and hold for 5-10 sec. Repeat this 10-20 times hourly. Hamstring sets are done by pushing the  foot backward against an object and holding for 5-10 sec. Repeat as with quad sets.  °· Leg Slides: Lying on your back, slowly slide your foot toward your buttocks, bending your knee up off the floor (only go as far as is comfortable). Then slowly slide your foot back down until your leg is flat on the floor again. °· Angel Wings: Lying on your back spread your legs to the side as far apart as you can without causing discomfort.  °A rehabilitation program following serious knee injuries can speed recovery and prevent re-injury in the future due to weakened muscles. Contact your doctor or a physical therapist for more information on knee rehabilitation.  ° °IF YOU ARE TRANSFERRED TO A SKILLED REHAB FACILITY °If the patient is transferred to a skilled rehab facility following release from the hospital, a list of the current medications will be sent to the facility for the patient to continue.  When discharged from the skilled rehab facility, please have the facility set up the patient's Home Health Physical Therapy prior to being released. Also, the skilled facility will be responsible for providing the patient with their medications at time of release from the facility to include their pain medication, the muscle relaxants, and their blood thinner medication. If the patient is still at the rehab facility at time of the two week follow up appointment, the skilled rehab facility will also need to assist the patient in arranging follow up appointment in our office and any transportation needs. ° °MAKE SURE YOU:  °Understand these instructions.  °Get help right away if you are not doing well or get worse.  ° ° °Pick up stool softner and laxative for home use following surgery while on pain medications. °Do not submerge incision under water. °Please use good hand washing techniques while changing dressing each day. °May shower starting three days after surgery. °Please use a clean towel to pat the incision dry following  showers. °Continue to use ice for pain and swelling after surgery. °Do not use any lotions or creams on the incision until instructed by your surgeon. ° °Take Xarelto for two and a half more weeks following discharge from the hospital, then discontinue Xarelto. °Once the patient has completed the Xarelto, they may resume the 81 mg Aspirin. ° ° ° °Information on   my medicine - XARELTO (Rivaroxaban)  This medication education was reviewed with me or my healthcare representative as part of my discharge preparation.    Why was Xarelto prescribed for you? Xarelto was prescribed for you to reduce the risk of blood clots forming after orthopedic surgery. The medical term for these abnormal blood clots is venous thromboembolism (VTE).  What do you need to know about xarelto ? Take your Xarelto ONCE DAILY at the same time every day. You may take it either with or without food.  If you have difficulty swallowing the tablet whole, you may crush it and mix in applesauce just prior to taking your dose.  Take Xarelto exactly as prescribed by your doctor and DO NOT stop taking Xarelto without talking to the doctor who prescribed the medication.  Stopping without other VTE prevention medication to take the place of Xarelto may increase your risk of developing a clot.  After discharge, you should have regular check-up appointments with your healthcare provider that is prescribing your Xarelto.    What do you do if you miss a dose? If you miss a dose, take it as soon as you remember on the same day then continue your regularly scheduled once daily regimen the next day. Do not take two doses of Xarelto on the same day.   Important Safety Information A possible side effect of Xarelto is bleeding. You should call your healthcare provider right away if you experience any of the following: ? Bleeding from an injury or your nose that does not stop. ? Unusual colored urine (red or dark brown) or unusual  colored stools (red or black). ? Unusual bruising for unknown reasons. ? A serious fall or if you hit your head (even if there is no bleeding).  Some medicines may interact with Xarelto and might increase your risk of bleeding while on Xarelto. To help avoid this, consult your healthcare provider or pharmacist prior to using any new prescription or non-prescription medications, including herbals, vitamins, non-steroidal anti-inflammatory drugs (NSAIDs) and supplements.  This website has more information on Xarelto: [email or web address]

## 2017-01-28 NOTE — Progress Notes (Signed)
   Subjective: 1 Day Post-Op Procedure(s) (LRB): RIGHT TOTAL KNEE ARTHROPLASTY (Right) Patient reports pain as mild.   Patient seen in rounds for Dr. Wynelle Link.  Daughter in room at bedside. Patient is well, but has had some minor complaints of pain in the knee, requiring pain medications We will resume therapy today.  He walked about 90 feet yesterday with therapy.   Plan is to go Home after hospital stay.  Objective: Vital signs in last 24 hours: Temp:  [97.5 F (36.4 C)-98.2 F (36.8 C)] 98 F (36.7 C) (10/09 1023) Pulse Rate:  [58-64] 61 (10/09 1023) Resp:  [16-18] 18 (10/09 1023) BP: (106-138)/(60-72) 124/68 (10/09 1023) SpO2:  [94 %-98 %] 97 % (10/09 1023)  Intake/Output from previous day:  Intake/Output Summary (Last 24 hours) at 01/28/17 1130 Last data filed at 01/28/17 1024  Gross per 24 hour  Intake          2807.67 ml  Output             3390 ml  Net          -582.33 ml    Intake/Output this shift: Total I/O In: 360 [P.O.:360] Out: 250 [Urine:250]  Labs:  Recent Labs  01/28/17 0606  HGB 12.2*    Recent Labs  01/28/17 0606  WBC 13.2*  RBC 3.93*  HCT 35.8*  PLT 242    Recent Labs  01/28/17 0606  NA 135  K 4.5  CL 103  CO2 22  BUN 21*  CREATININE 1.40*  GLUCOSE 142*  CALCIUM 8.4*   No results for input(s): LABPT, INR in the last 72 hours.  EXAM General - Patient is Alert, Appropriate and Oriented Extremity - Neurovascular intact Sensation intact distally Intact pulses distally Dorsiflexion/Plantar flexion intact Dressing - dressing C/D/I Motor Function - intact, moving foot and toes well on exam.  Hemovac pulled without difficulty.  Past Medical History:  Diagnosis Date  . Arthritis    right knee   . Bradycardia   . Cancer ([place])    melanoma on right shoulder , skin cancer   . Chronic kidney disease    stage III  . Complication of anesthesia   . Hypertension   . Plaque psoriasis   . Pneumonia    hx of years ago   .  Psoriasis   . Sleep apnea    cpap - heated machine 9 setting     Assessment/Plan: 1 Day Post-Op Procedure(s) (LRB): RIGHT TOTAL KNEE ARTHROPLASTY (Right) Principal Problem:   OA (osteoarthritis) of knee  Estimated body mass index is 26.88 kg/m as calculated from the following:   Height as of this encounter: 5\' 9"  (1.753 m).   Weight as of this encounter: 82.6 kg (182 lb). Advance diet Up with therapy Plan for discharge tomorrow  DVT Prophylaxis - Xarelto Weight-Bearing as tolerated to right leg D/C O2 and Pulse OX and try on Room Air  Arlee Muslim, PA-C Orthopaedic Surgery 01/28/2017, 11:30 AM

## 2017-01-28 NOTE — Progress Notes (Signed)
Physical Therapy Treatment Patient Details Name: Adam Daniel MRN: [id number] DOB: Mar 07, 1946 Today's Date: 01/28/2017    History of Present Illness R tka    PT Comments    The patient is progressing well today. Will need to practice steps with wife in AM.    Follow Up Recommendations  Outpatient PT;DC plan and follow up therapy as arranged by surgeon     Equipment Recommendations  None recommended by PT    Recommendations for Other Services       Precautions / Restrictions Precautions Precautions: Fall;Knee Required Braces or Orthoses: Knee Immobilizer - Left Knee Immobilizer - Left: Discontinue once straight leg raise with < 10 degree lag    Mobility  Bed Mobility Overal bed mobility: Needs Assistance Bed Mobility: Sit to Supine     Supine to sit: Supervision     General bed mobility comments: self assist  with left leg  Transfers Overall transfer level: Needs assistance Equipment used: Rolling walker (2 wheeled) Transfers: Sit to/from Stand Sit to Stand: Min guard         General transfer comment: cues for UE/LE placement  Ambulation/Gait Ambulation/Gait assistance: Min guard Ambulation Distance (Feet): 60 Feet Assistive device: Rolling walker (2 wheeled) Gait Pattern/deviations: Step-to pattern;Step-through pattern     General Gait Details: cues for sequence    Stairs Stairs: Yes   Stair Management: Backwards;No rails;With walker Number of Stairs: 2 General stair comments: daughter present, practiced x 2. will need to practice w/ wife  Wheelchair Mobility    Modified Rankin (Stroke Patients Only)       Balance                                            Cognition Arousal/Alertness: Awake/alert Behavior During Therapy: Impulsive                                          Exercises     General Comments General comments (skin integrity, edema, etc.): at times, does not focus, lost balnce x  1      Pertinent Vitals/Pain Pain Score: 4  Pain Location:  right knee Pain Descriptors / Indicators: Discomfort;Sore Pain Intervention(s): Premedicated before session;Repositioned    Home Living                      Prior Function            PT Goals (current goals can now be found in the care plan section) Progress towards PT goals: Progressing toward goals    Frequency    7X/week      PT Plan Current plan remains appropriate    Co-evaluation              AM-PAC PT "6 Clicks" Daily Activity  Outcome Measure  Difficulty turning over in bed (including adjusting bedclothes, sheets and blankets)?: A Little Difficulty moving from lying on back to sitting on the side of the bed? : A Little Difficulty sitting down on and standing up from a chair with arms (e.g., wheelchair, bedside commode, etc,.)?: A Little Help needed moving to and from a bed to chair (including a wheelchair)?: A Little Help needed walking in hospital room?: A Little Help needed climbing 3-5 steps with a railing? :  A Lot 6 Click Score: 17    End of Session Equipment Utilized During Treatment: Right knee immobilizer Activity Tolerance: Patient tolerated treatment well Patient left: in bed;with call bell/phone within reach;with family/visitor present Nurse Communication: Mobility status PT Visit Diagnosis: Pain;Unsteadiness on feet (R26.81) Pain - Right/Left: Right Pain - part of body: Knee     Time: 1444-1500 PT Time Calculation (min) (ACUTE ONLY): 16 min  Charges:  $Gait Training: 8-22 mins $Therapeutic Exercise: 8-22 mins $Self Care/Home Management: 8-22                    G CodesTresa Daniel PT [phone]    Adam Daniel 01/28/2017, 4:10 PM

## 2017-01-28 NOTE — Progress Notes (Signed)
Physical Therapy Treatment Patient Details Name: Adam Daniel MRN: [id number] DOB: 09/10/1945 Today's Date: 01/28/2017    History of Present Illness R tka    PT Comments    The patient is progressing, many questions arise, answered as best as possible.   Follow Up Recommendations  Outpatient PT;DC plan and follow up therapy as arranged by surgeon     Equipment Recommendations  None recommended by PT    Recommendations for Other Services       Precautions / Restrictions Precautions Precautions: Fall;Knee Required Braces or Orthoses: Knee Immobilizer - Left Knee Immobilizer - Left: Discontinue once straight leg raise with < 10 degree lag    Mobility  Bed Mobility Overal bed mobility: Needs Assistance Bed Mobility: Supine to Sit;Sit to Supine     Supine to sit: Supervision     General bed mobility comments: self assist  with left leg  Transfers Overall transfer level: Needs assistance Equipment used: Rolling walker (2 wheeled) Transfers: Sit to/from Stand Sit to Stand: Min guard         General transfer comment: cues for UE/LE placementg  Ambulation/Gait Ambulation/Gait assistance: Min guard Ambulation Distance (Feet): 140 Feet Assistive device: Rolling walker (2 wheeled) Gait Pattern/deviations: Step-to pattern;Step-through pattern     General Gait Details: cues for sequence    Stairs            Wheelchair Mobility    Modified Rankin (Stroke Patients Only)       Balance                                            Cognition Arousal/Alertness: Awake/alert Behavior During Therapy: Impulsive                                          Exercises Total Joint Exercises Ankle Circles/Pumps: AROM Quad Sets: AROM Towel Squeeze: AROM Heel Slides: AAROM Hip ABduction/ADduction: AROM Straight Leg Raises: AAROM Goniometric ROM: 10-90 left knee    General Comments        Pertinent Vitals/Pain Pain  Score: 2  Pain Location:  right knee Pain Descriptors / Indicators: Discomfort;Sore Pain Intervention(s): Repositioned;Patient requesting pain meds-RN notified;Ice applied;Monitored during session    Home Living                      Prior Function            PT Goals (current goals can now be found in the care plan section) Progress towards PT goals: Progressing toward goals    Frequency    7X/week      PT Plan Current plan remains appropriate    Co-evaluation              AM-PAC PT "6 Clicks" Daily Activity  Outcome Measure  Difficulty turning over in bed (including adjusting bedclothes, sheets and blankets)?: A Little Difficulty moving from lying on back to sitting on the side of the bed? : A Little Difficulty sitting down on and standing up from a chair with arms (e.g., wheelchair, bedside commode, etc,.)?: A Little Help needed moving to and from a bed to chair (including a wheelchair)?: A Little Help needed walking in hospital room?: A Little Help needed climbing 3-5 steps with a railing? :  A Lot 6 Click Score: 17    End of Session Equipment Utilized During Treatment: Right knee immobilizer Activity Tolerance: Patient tolerated treatment well Patient left: in chair;with call bell/phone within reach;with family/visitor present Nurse Communication: Mobility status PT Visit Diagnosis: Pain;Unsteadiness on feet (R26.81) Pain - Right/Left: Right Pain - part of body: Knee     Time: 5465-0354 PT Time Calculation (min) (ACUTE ONLY): 50 min  Charges:  $Gait Training: 8-22 mins $Therapeutic Exercise: 8-22 mins $Self Care/Home Management: 8-22                    G CodesTresa Daniel PT Adam Daniel 01/28/2017, 1:16 PM

## 2017-01-28 NOTE — Discharge Summary (Signed)
Physician Discharge Summary   Patient ID: Adam Daniel MRN: [id number] DOB/AGE: 12/13/1945 71 y.o.  Admit date: 01/27/2017 Discharge date: 01/29/2017  Primary Diagnosis:  Osteoarthritis  Right knee(s) Admission Diagnoses:  Past Medical History:  Diagnosis Date  . Arthritis    right knee   . Bradycardia   . Cancer ([place])    melanoma on right shoulder , skin cancer   . Chronic kidney disease    stage III  . Complication of anesthesia   . Hypertension   . Plaque psoriasis   . Pneumonia    hx of years ago   . Psoriasis   . Sleep apnea    cpap - heated machine 9 setting    Discharge Diagnoses:   Principal Problem:   OA (osteoarthritis) of knee  Estimated body mass index is 26.88 kg/m as calculated from the following:   Height as of this encounter: 5' 9" (1.753 m).   Weight as of this encounter: 82.6 kg (182 lb).  Procedure:  Procedure(s) (LRB): RIGHT TOTAL KNEE ARTHROPLASTY (Right)   Consults: None  HPI: Adam Daniel is a 71 y.o. year old male with end stage OA of his right knee with progressively worsening pain and dysfunction. He has constant pain, with activity and at rest and significant functional deficits with difficulties even with ADLs. He has had extensive non-op management including analgesics, injections of cortisone and viscosupplements, and home exercise program, but remains in significant pain with significant dysfunction. Radiographs show bone on bone arthritis medial and patellofemoral. He presents now for right Total Knee Arthroplasty.   Laboratory Data: Admission on 01/27/2017  Component Date Value Ref Range Status  . WBC 01/28/2017 13.2* 4.0 - 10.5 K/uL Final  . RBC 01/28/2017 3.93* 4.22 - 5.81 MIL/uL Final  . Hemoglobin 01/28/2017 12.2* 13.0 - 17.0 g/dL Final  . HCT 01/28/2017 35.8* 39.0 - 52.0 % Final  . MCV 01/28/2017 91.1  78.0 - 100.0 fL Final  . MCH 01/28/2017 31.0  26.0 - 34.0 pg Final  . MCHC 01/28/2017 34.1  30.0 - 36.0 g/dL  Final  . RDW 01/28/2017 13.2  11.5 - 15.5 % Final  . Platelets 01/28/2017 242  150 - 400 K/uL Final  . Sodium 01/28/2017 135  135 - 145 mmol/L Final  . Potassium 01/28/2017 4.5  3.5 - 5.1 mmol/L Final  . Chloride 01/28/2017 103  101 - 111 mmol/L Final  . CO2 01/28/2017 22  22 - 32 mmol/L Final  . Glucose, Bld 01/28/2017 142* 65 - 99 mg/dL Final  . BUN 01/28/2017 21* 6 - 20 mg/dL Final  . Creatinine, Ser 01/28/2017 1.40* 0.61 - 1.24 mg/dL Final  . Calcium 01/28/2017 8.4* 8.9 - 10.3 mg/dL Final  . GFR calc non Af Amer 01/28/2017 48* >60 mL/min Final  . GFR calc Af Amer 01/28/2017 56* >60 mL/min Final   Comment: (NOTE) The eGFR has been calculated using the CKD EPI equation. This calculation has not been validated in all clinical situations. eGFR's persistently <60 mL/min signify possible Chronic Kidney Disease.   Georgiann Hahn gap 01/28/2017 10  5 - 15 Final  Hospital Outpatient Visit on 01/20/2017  Component Date Value Ref Range Status  . aPTT 01/20/2017 29  24 - 36 seconds Final  . WBC 01/20/2017 6.6  4.0 - 10.5 K/uL Final  . RBC 01/20/2017 4.82  4.22 - 5.81 MIL/uL Final  . Hemoglobin 01/20/2017 14.9  13.0 - 17.0 g/dL Final  . HCT 01/20/2017 43.3  39.0 -  52.0 % Final  . MCV 01/20/2017 89.8  78.0 - 100.0 fL Final  . MCH 01/20/2017 30.9  26.0 - 34.0 pg Final  . MCHC 01/20/2017 34.4  30.0 - 36.0 g/dL Final  . RDW 01/20/2017 13.3  11.5 - 15.5 % Final  . Platelets 01/20/2017 271  150 - 400 K/uL Final  . Sodium 01/20/2017 140  135 - 145 mmol/L Final  . Potassium 01/20/2017 4.3  3.5 - 5.1 mmol/L Final  . Chloride 01/20/2017 107  101 - 111 mmol/L Final  . CO2 01/20/2017 24  22 - 32 mmol/L Final  . Glucose, Bld 01/20/2017 99  65 - 99 mg/dL Final  . BUN 01/20/2017 20  6 - 20 mg/dL Final  . Creatinine, Ser 01/20/2017 1.27* 0.61 - 1.24 mg/dL Final  . Calcium 01/20/2017 9.0  8.9 - 10.3 mg/dL Final  . Total Protein 01/20/2017 6.8  6.5 - 8.1 g/dL Final  . Albumin 01/20/2017 4.1  3.5 - 5.0 g/dL  Final  . AST 01/20/2017 25  15 - 41 U/L Final  . ALT 01/20/2017 20  17 - 63 U/L Final  . Alkaline Phosphatase 01/20/2017 55  38 - 126 U/L Final  . Total Bilirubin 01/20/2017 0.4  0.3 - 1.2 mg/dL Final  . GFR calc non Af Amer 01/20/2017 54* >60 mL/min Final  . GFR calc Af Amer 01/20/2017 >60  >60 mL/min Final   Comment: (NOTE) The eGFR has been calculated using the CKD EPI equation. This calculation has not been validated in all clinical situations. eGFR's persistently <60 mL/min signify possible Chronic Kidney Disease.   . Anion gap 01/20/2017 9  5 - 15 Final  . Prothrombin Time 01/20/2017 12.5  11.4 - 15.2 seconds Final  . INR 01/20/2017 0.94   Final  . ABO/RH(D) 01/20/2017 A NEG   Final  . Antibody Screen 01/20/2017 NEG   Final  . Sample Expiration 01/20/2017 01/30/2017   Final  . Extend sample reason 01/20/2017 NO TRANSFUSIONS OR PREGNANCY IN THE PAST 3 MONTHS   Final  . MRSA, PCR 01/20/2017 NEGATIVE  NEGATIVE Final  . Staphylococcus aureus 01/20/2017 NEGATIVE  NEGATIVE Final   Comment: (NOTE) The Xpert SA Assay (FDA approved for NASAL specimens in patients 31 years of age and older), is one component of a comprehensive surveillance program. It is not intended to diagnose infection nor to guide or monitor treatment.      X-Rays:No results found.  EKG: Orders placed or performed during the hospital encounter of 03/26/16  . EKG 12 lead  . EKG 12 lead     Hospital Course: [name] is a 71 y.o. who was admitted to [facility]. They were brought to the operating room on 01/27/2017 and underwent Procedure(s): RIGHT TOTAL KNEE ARTHROPLASTY.  Patient tolerated the procedure well and was later transferred to the recovery room and then to the orthopaedic floor for postoperative care.  They were given PO and IV analgesics for pain control following their surgery.  They were given 24 hours of postoperative antibiotics of  Anti-infectives    Start     Dose/Rate  Route Frequency Ordered Stop   01/27/17 1400  ceFAZolin (ANCEF) IVPB 2g/100 mL premix     2 g 200 mL/hr over 30 Minutes Intravenous Every 6 hours 01/27/17 1011 01/27/17 2125   01/27/17 0518  ceFAZolin (ANCEF) 2-4 GM/100ML-% IVPB    Comments:  [place] Session   : cabinet override      01/27/17 0518 01/27/17  [id number]   01/27/17 0514  ceFAZolin (ANCEF) IVPB 2g/100 mL premix     2 g 200 mL/hr over 30 Minutes Intravenous On call to O.R. 01/27/17 [id number] 01/27/17 0726     and started on DVT prophylaxis in the form of Xarelto.   PT and OT were ordered for total joint protocol.  Discharge planning consulted to help with postop disposition and equipment needs.  Patient had a good night on the evening of surgery.  They started to get up OOB with therapy on day one. Hemovac drain was pulled without difficulty.  Continued to work with therapy into day two.  Dressing was changed on day two and the incision was healing well.  Patient was seen in rounds on POD 2 and was ready to go home.  Diet - Cardiac diet and Renal diet Follow up - in 2 weeks Activity - WBAT Disposition - Home Condition Upon Discharge - Good D/C Meds - See DC Summary DVT Prophylaxis - Xarelto   Discharge Instructions    Call MD / Call 911    Complete by:  As directed    If you experience chest pain or shortness of breath, CALL 911 and be transported to the hospital emergency room.  If you develope a fever above 101 F, pus (white drainage) or increased drainage or redness at the wound, or calf pain, call your surgeon's office.   Change dressing    Complete by:  As directed    Change dressing daily with sterile 4 x 4 inch gauze dressing and apply TED hose. Do not submerge the incision under water.   Constipation Prevention    Complete by:  As directed    Drink plenty of fluids.  Prune juice may be helpful.  You may use a stool softener, such as Colace (over the counter) 100 mg twice a day.  Use MiraLax (over the counter) for constipation  as needed.   Diet - low sodium heart healthy    Complete by:  As directed    Diet Carb Modified    Complete by:  As directed    Discharge instructions    Complete by:  As directed    Take Xarelto for two and a half more weeks, then discontinue Xarelto. Once the patient has completed the Xarelto, they may resume the 81 mg Aspirin.   Pick up stool softner and laxative for home use following surgery while on pain medications. Do not submerge incision under water. Please use good hand washing techniques while changing dressing each day. May shower starting three days after surgery. Please use a clean towel to pat the incision dry following showers. Continue to use ice for pain and swelling after surgery. Do not use any lotions or creams on the incision until instructed by your surgeon.  Wear both TED hose on both legs during the day every day for three weeks, but may remove the TED hose at night at home.  Postoperative Constipation Protocol  Constipation - defined medically as fewer than three stools per week and severe constipation as less than one stool per week.  One of the most common issues patients have following surgery is constipation.  Even if you have a regular bowel pattern at home, your normal regimen is likely to be disrupted due to multiple reasons following surgery.  Combination of anesthesia, postoperative narcotics, change in appetite and fluid intake all can affect your bowels.  In order to avoid complications following surgery, here are some recommendations in  order to help you during your recovery period.  Colace (docusate) - Pick up an over-the-counter form of Colace or another stool softener and take twice a day as long as you are requiring postoperative pain medications.  Take with a full glass of water daily.  If you experience loose stools or diarrhea, hold the colace until you stool forms back up.  If your symptoms do not get better within 1 week or if they get worse,  check with your doctor.  Dulcolax (bisacodyl) - Pick up over-the-counter and take as directed by the product packaging as needed to assist with the movement of your bowels.  Take with a full glass of water.  Use this product as needed if not relieved by Colace only.   MiraLax (polyethylene glycol) - Pick up over-the-counter to have on hand.  MiraLax is a solution that will increase the amount of water in your bowels to assist with bowel movements.  Take as directed and can mix with a glass of water, juice, soda, coffee, or tea.  Take if you go more than two days without a movement. Do not use MiraLax more than once per day. Call your doctor if you are still constipated or irregular after using this medication for 7 days in a row.  If you continue to have problems with postoperative constipation, please contact the office for further assistance and recommendations.  If you experience "the worst abdominal pain ever" or develop nausea or vomiting, please contact the office immediatly for further recommendations for treatment.   Do not put a pillow under the knee. Place it under the heel.    Complete by:  As directed    Do not sit on low chairs, stoools or toilet seats, as it may be difficult to get up from low surfaces    Complete by:  As directed    Driving restrictions    Complete by:  As directed    No driving until released by the physician.   Increase activity slowly as tolerated    Complete by:  As directed    Lifting restrictions    Complete by:  As directed    No lifting until released by the physician.   Patient may shower    Complete by:  As directed    You may shower without a dressing once there is no drainage.  Do not wash over the wound.  If drainage remains, do not shower until drainage stops.   TED hose    Complete by:  As directed    Use stockings (TED hose) for 3 weeks on both leg(s).  You may remove them at night for sleeping.   Weight bearing as tolerated    Complete by:   As directed    Laterality:  right   Extremity:  Lower     Allergies as of 01/28/2017   No Known Allergies     Medication List    STOP taking these medications   aspirin EC 81 MG tablet   HYDROcodone-acetaminophen 5-325 MG tablet Commonly known as:  NORCO/VICODIN     TAKE these medications   acetaminophen 325 MG tablet Commonly known as:  TYLENOL Take 650 mg by mouth every 6 (six) hours as needed for mild pain.   amLODipine 10 MG tablet Commonly known as:  NORVASC Take 10 mg by mouth daily.   citalopram 20 MG tablet Commonly known as:  CELEXA Take 20 mg by mouth daily.   fluticasone 0.05 % cream Commonly  known as:  CUTIVATE Apply 1 application topically daily as needed.   fluticasone 50 MCG/ACT nasal spray Commonly known as:  FLONASE Place 2 sprays into both nostrils daily as needed for allergies.   loratadine 10 MG tablet Commonly known as:  CLARITIN Take 10 mg by mouth daily.   losartan 100 MG tablet Commonly known as:  COZAAR Take 100 mg by mouth daily.   methocarbamol 500 MG tablet Commonly known as:  ROBAXIN Take 1 tablet (500 mg total) by mouth every 6 (six) hours as needed for muscle spasms.   oxyCODONE 5 MG immediate release tablet Commonly known as:  Oxy IR/ROXICODONE Take 1-2 tablets (5-10 mg total) by mouth every 4 (four) hours as needed for moderate pain or severe pain.   rivaroxaban 10 MG Tabs tablet Commonly known as:  XARELTO Take 1 tablet (10 mg total) by mouth daily with breakfast. Take Xarelto for two and a half more weeks following discharge from the hospital, then discontinue Xarelto. Once the patient has completed the Xarelto, they may resume the 81 mg Aspirin.   rosuvastatin 5 MG tablet Commonly known as:  CRESTOR Take 5 mg by mouth at bedtime.            Discharge Care Instructions        Start     Ordered   01/28/17 0000  Weight bearing as tolerated    Question Answer Comment  Laterality right   Extremity Lower       01/28/17 2333   01/28/17 0000  Change dressing    Comments:  Change dressing daily with sterile 4 x 4 inch gauze dressing and apply TED hose. Do not submerge the incision under water.   01/28/17 2333     Follow-up Information    Gaynelle Arabian, MD. Schedule an appointment as soon as possible for a visit on 02/11/2017.   Specialty:  Orthopedic Surgery Contact information: [address] [place] [address] [phone]           Signed: Arlee Muslim, PA-C Orthopaedic Surgery 01/28/2017, 11:34 PM

## 2017-01-29 LAB — BASIC METABOLIC PANEL
Anion gap: 10 (ref 5–15)
BUN: 26 mg/dL — AB (ref 6–20)
CALCIUM: 8.6 mg/dL — AB (ref 8.9–10.3)
CHLORIDE: 106 mmol/L (ref 101–111)
CO2: 22 mmol/L (ref 22–32)
CREATININE: 1.51 mg/dL — AB (ref 0.61–1.24)
GFR calc non Af Amer: 44 mL/min — ABNORMAL LOW (ref >60)
GFR, EST AFRICAN AMERICAN: 51 mL/min — AB (ref >60)
Glucose, Bld: 100 mg/dL — ABNORMAL HIGH (ref 65–99)
Potassium: 4.2 mmol/L (ref 3.5–5.1)
SODIUM: 138 mmol/L (ref 135–145)

## 2017-01-29 LAB — CBC
HCT: 35.9 % — ABNORMAL LOW (ref 39.0–52.0)
Hemoglobin: 12.1 g/dL — ABNORMAL LOW (ref 13.0–17.0)
MCH: 31 pg (ref 26.0–34.0)
MCHC: 33.7 g/dL (ref 30.0–36.0)
MCV: 92.1 fL (ref 78.0–100.0)
Platelets: 245 10*3/uL (ref 150–400)
RBC: 3.9 MIL/uL — ABNORMAL LOW (ref 4.22–5.81)
RDW: 13.5 % (ref 11.5–15.5)
WBC: 13.8 10*3/uL — ABNORMAL HIGH (ref 4.0–10.5)

## 2017-01-29 NOTE — Progress Notes (Signed)
Occupational Therapy Treatment Patient Details Name: Adam Daniel MRN: [id number] DOB: October 08, 1945 Today's Date: 01/29/2017    History of present illness R tka   OT comments  All education completed this session. Pt got dressed and practiced commode transfer  Follow Up Recommendations  Supervision/Assistance - 24 hour    Equipment Recommendations  None recommended by OT    Recommendations for Other Services      Precautions / Restrictions Precautions Precautions: Fall;Knee Precaution Comments: pt walking without ki Restrictions Weight Bearing Restrictions: No Other Position/Activity Restrictions: wbat       Mobility Bed Mobility               General bed mobility comments: oob  Transfers   Equipment used: Rolling walker (2 wheeled)   Sit to Stand: Supervision              Balance                                           ADL either performed or assessed with clinical judgement   ADL       Grooming: Set up;Standing               Lower Body Dressing: Supervision/safety;Sit to/from stand (pants only with reacher)   Toilet Transfer: Supervision/safety;Ambulation;BSC             General ADL Comments: pt only needed cues today for over-reaching.  Cued to get close to objects.  Used reacher for underwear and pants:  he has this at home     Vision       Perception     Praxis      Cognition Arousal/Alertness: Awake/alert Behavior During Therapy: WFL for tasks assessed/performed Overall Cognitive Status: Within Functional Limits for tasks assessed                                          Exercises     Shoulder Instructions       General Comments      Pertinent Vitals/ Pain       Pain Score: 3  Pain Location:  right knee Pain Descriptors / Indicators: Discomfort;Sore Pain Intervention(s): Limited activity within patient's tolerance;Monitored during session;Premedicated before  session;Repositioned  Home Living                                          Prior Functioning/Environment              Frequency           Progress Toward Goals  OT Goals(current goals can now be found in the care plan section)  Progress towards OT goals: Progressing toward goals     Plan      Co-evaluation                 AM-PAC PT "6 Clicks" Daily Activity     Outcome Measure   Help from another person eating meals?: None Help from another person taking care of personal grooming?: A Little Help from another person toileting, which includes using toliet, bedpan, or urinal?: A Little Help from another person bathing (including washing, rinsing, drying)?: A Little  Help from another person to put on and taking off regular upper body clothing?: A Little Help from another person to put on and taking off regular lower body clothing?: A Lot 6 Click Score: 18    End of Session CPM Right Knee CPM Right Knee: Off  OT Visit Diagnosis: Pain Pain - Right/Left: Left Pain - part of body: Knee   Activity Tolerance Patient tolerated treatment well   Patient Left in chair;with call bell/phone within reach;with family/visitor present   Nurse Communication          Time: 1103-1594 OT Time Calculation (min): 21 min  Charges: OT General Charges $OT Visit: 1 Visit OT Treatments $Self Care/Home Management : 8-22 mins  Lesle Chris, OTR/L [phone] 01/29/2017   [name] 01/29/2017, 11:02 AM

## 2017-01-29 NOTE — Progress Notes (Signed)
RN reviewed discharge instructions with patient and family. All questions answered.   Paperwork and prescriptions given.   NT rolled patient down with all belongings to family car. 

## 2017-01-29 NOTE — Progress Notes (Signed)
   Subjective: 2 Days Post-Op Procedure(s) (LRB): RIGHT TOTAL KNEE ARTHROPLASTY (Right) Patient reports pain as mild.   Patient seen in rounds with Dr. Wynelle Link. Patient is well, but has had some minor complaints of pain in the knee, requiring pain medications Patient is ready to go home  Objective: Vital signs in last 24 hours: Temp:  [97.9 F (36.6 C)-99 F (37.2 C)] 98.9 F (37.2 C) (10/10 0621) Pulse Rate:  [57-73] 57 (10/10 0621) Resp:  [16-18] 16 (10/10 0621) BP: (124-154)/(62-85) 136/67 (10/10 0621) SpO2:  [91 %-97 %] 95 % (10/10 0621)  Intake/Output from previous day:  Intake/Output Summary (Last 24 hours) at 01/29/17 0804 Last data filed at 01/29/17 0700  Gross per 24 hour  Intake          3099.83 ml  Output             2645 ml  Net           454.83 ml    Intake/Output this shift: No intake/output data recorded.  Labs:  Recent Labs  01/28/17 0606 01/29/17 0557  HGB 12.2* 12.1*    Recent Labs  01/28/17 0606 01/29/17 0557  WBC 13.2* 13.8*  RBC 3.93* 3.90*  HCT 35.8* 35.9*  PLT 242 245    Recent Labs  01/28/17 0606 01/29/17 0557  NA 135 138  K 4.5 4.2  CL 103 106  CO2 22 22  BUN 21* 26*  CREATININE 1.40* 1.51*  GLUCOSE 142* 100*  CALCIUM 8.4* 8.6*   No results for input(s): LABPT, INR in the last 72 hours.  EXAM: General - Patient is Alert and Appropriate Extremity - Neurovascular intact Sensation intact distally Incision - clean, dry Motor Function - intact, moving foot and toes well on exam.   Assessment/Plan: 2 Days Post-Op Procedure(s) (LRB): RIGHT TOTAL KNEE ARTHROPLASTY (Right) Procedure(s) (LRB): RIGHT TOTAL KNEE ARTHROPLASTY (Right) Past Medical History:  Diagnosis Date  . Arthritis    right knee   . Bradycardia   . Cancer ([place])    melanoma on right shoulder , skin cancer   . Chronic kidney disease    stage III  . Complication of anesthesia   . Hypertension   . Plaque psoriasis   . Pneumonia    hx of years ago     . Psoriasis   . Sleep apnea    cpap - heated machine 9 setting    Principal Problem:   OA (osteoarthritis) of knee  Estimated body mass index is 26.88 kg/m as calculated from the following:   Height as of this encounter: 5\' 9"  (1.753 m).   Weight as of this encounter: 82.6 kg (182 lb). Up with therapy Diet - Cardiac diet and Renal diet Follow up - in 2 weeks Activity - WBAT Disposition - Home Condition Upon Discharge - Good D/C Meds - See DC Summary DVT Prophylaxis - Xarelto  Arlee Muslim, PA-C Orthopaedic Surgery 01/29/2017, 8:04 AM

## 2017-01-29 NOTE — Progress Notes (Signed)
Physical Therapy Treatment Patient Details Name: Adam Daniel MRN: [id number] DOB: [birth year] Today's Date: 01/29/2017    History of Present Illness R tka    PT Comments    All education is completed. ready for DC.   Follow Up Recommendations  Outpatient PT;DC plan and follow up therapy as arranged by surgeon     Equipment Recommendations  None recommended by PT    Recommendations for Other Services       Precautions / Restrictions Precautions Precautions: Fall;Knee Precaution Comments: pt walking without ki Required Braces or Orthoses: Knee Immobilizer - Left Knee Immobilizer - Left: Discontinue once straight leg raise with < 10 degree lag    Mobility  Bed Mobility   Bed Mobility: Supine to Sit     Supine to sit: Modified independent (Device/Increase time)        Transfers Overall transfer level: Needs assistance Equipment used: Rolling walker (2 wheeled) Transfers: Sit to/from Stand Sit to Stand: Supervision         General transfer comment: cues for UE/LE placement  Ambulation/Gait Ambulation/Gait assistance: Min guard Ambulation Distance (Feet): 100 Feet Assistive device: Rolling walker (2 wheeled) Gait Pattern/deviations: Step-to pattern;Step-through pattern     General Gait Details: cues for sequence    Stairs Stairs: Yes   Stair Management: Backwards;No rails;With walker Number of Stairs: 2 General stair comments: daughter and wife present, practiced x 2. , practiced 1 forward with RW  Wheelchair Mobility    Modified Rankin (Stroke Patients Only)       Balance                                            Cognition Arousal/Alertness: Awake/alert Behavior During Therapy: Impulsive                                          Exercises Total Joint Exercises Ankle Circles/Pumps: Both;10 reps Quad Sets: Both;10 reps Towel Squeeze: AROM;Both;10 reps Short Arc QuadSinclair Ship;Right;10  reps Heel Slides: AAROM;Right;10 reps Hip ABduction/ADduction: AROM;Right;10 reps Straight Leg Raises: AAROM;Right;10 reps Long Arc Quad: AROM;Right;10 reps Knee Flexion: AROM;Right;10 reps Goniometric ROM: 10-90 right knee    General Comments        Pertinent Vitals/Pain Pain Score: 3  Pain Descriptors / Indicators: Discomfort;Sore Pain Intervention(s): Premedicated before session;Repositioned    Home Living                      Prior Function            PT Goals (current goals can now be found in the care plan section) Progress towards PT goals: Progressing toward goals    Frequency    7X/week      PT Plan Current plan remains appropriate    Co-evaluation              AM-PAC PT "6 Clicks" Daily Activity  Outcome Measure  Difficulty turning over in bed (including adjusting bedclothes, sheets and blankets)?: None Difficulty moving from lying on back to sitting on the side of the bed? : None Difficulty sitting down on and standing up from a chair with arms (e.g., wheelchair, bedside commode, etc,.)?: A Little Help needed moving to and from a bed to chair (including a wheelchair)?: A Little  Help needed walking in hospital room?: A Little Help needed climbing 3-5 steps with a railing? : A Lot 6 Click Score: 19    End of Session Equipment Utilized During Treatment: Right knee immobilizer Activity Tolerance: Patient tolerated treatment well Patient left:  (in bathroom) Nurse Communication: Mobility status PT Visit Diagnosis: Pain;Unsteadiness on feet (R26.81) Pain - Right/Left: Right Pain - part of body: Knee     Time: 3343-5686 PT Time Calculation (min) (ACUTE ONLY): 69 min  Charges:  $Gait Training: 23-37 mins $Therapeutic Exercise: 8-22 mins $Self Care/Home Management: 23-37                    G Codes:          Claretha Cooper 01/29/2017, 2:34 PM

## 2017-01-31 DIAGNOSIS — M25661 Stiffness of right knee, not elsewhere classified: Secondary | ICD-10-CM | POA: Diagnosis not present

## 2017-02-03 DIAGNOSIS — M25661 Stiffness of right knee, not elsewhere classified: Secondary | ICD-10-CM | POA: Diagnosis not present

## 2017-02-05 DIAGNOSIS — M25661 Stiffness of right knee, not elsewhere classified: Secondary | ICD-10-CM | POA: Diagnosis not present

## 2017-02-07 DIAGNOSIS — M25661 Stiffness of right knee, not elsewhere classified: Secondary | ICD-10-CM | POA: Diagnosis not present

## 2017-02-10 DIAGNOSIS — M25661 Stiffness of right knee, not elsewhere classified: Secondary | ICD-10-CM | POA: Diagnosis not present

## 2017-02-11 DIAGNOSIS — M1711 Unilateral primary osteoarthritis, right knee: Secondary | ICD-10-CM | POA: Diagnosis not present

## 2017-02-12 DIAGNOSIS — M25661 Stiffness of right knee, not elsewhere classified: Secondary | ICD-10-CM | POA: Diagnosis not present

## 2017-02-14 DIAGNOSIS — M25661 Stiffness of right knee, not elsewhere classified: Secondary | ICD-10-CM | POA: Diagnosis not present

## 2017-02-18 DIAGNOSIS — M25661 Stiffness of right knee, not elsewhere classified: Secondary | ICD-10-CM | POA: Diagnosis not present

## 2017-02-20 DIAGNOSIS — M25661 Stiffness of right knee, not elsewhere classified: Secondary | ICD-10-CM | POA: Diagnosis not present

## 2017-02-25 DIAGNOSIS — M25661 Stiffness of right knee, not elsewhere classified: Secondary | ICD-10-CM | POA: Diagnosis not present

## 2017-02-27 DIAGNOSIS — M25661 Stiffness of right knee, not elsewhere classified: Secondary | ICD-10-CM | POA: Diagnosis not present

## 2017-03-04 DIAGNOSIS — M1711 Unilateral primary osteoarthritis, right knee: Secondary | ICD-10-CM | POA: Diagnosis not present

## 2017-03-11 DIAGNOSIS — M25661 Stiffness of right knee, not elsewhere classified: Secondary | ICD-10-CM | POA: Diagnosis not present

## 2017-03-19 DIAGNOSIS — M25661 Stiffness of right knee, not elsewhere classified: Secondary | ICD-10-CM | POA: Diagnosis not present

## 2017-04-08 DIAGNOSIS — M1711 Unilateral primary osteoarthritis, right knee: Secondary | ICD-10-CM | POA: Diagnosis not present

## 2017-05-01 DIAGNOSIS — Z85038 Personal history of other malignant neoplasm of large intestine: Secondary | ICD-10-CM | POA: Diagnosis not present

## 2017-05-01 DIAGNOSIS — I129 Hypertensive chronic kidney disease with stage 1 through stage 4 chronic kidney disease, or unspecified chronic kidney disease: Secondary | ICD-10-CM | POA: Diagnosis not present

## 2017-05-01 DIAGNOSIS — Z79899 Other long term (current) drug therapy: Secondary | ICD-10-CM | POA: Diagnosis not present

## 2017-05-01 DIAGNOSIS — N183 Chronic kidney disease, stage 3 (moderate): Secondary | ICD-10-CM | POA: Diagnosis not present

## 2017-05-01 DIAGNOSIS — K432 Incisional hernia without obstruction or gangrene: Secondary | ICD-10-CM | POA: Diagnosis not present

## 2017-05-08 DIAGNOSIS — K432 Incisional hernia without obstruction or gangrene: Secondary | ICD-10-CM | POA: Diagnosis not present

## 2017-05-08 DIAGNOSIS — Z9049 Acquired absence of other specified parts of digestive tract: Secondary | ICD-10-CM | POA: Diagnosis not present

## 2017-05-16 ENCOUNTER — Ambulatory Visit: Payer: Self-pay | Admitting: Surgery

## 2017-05-19 DIAGNOSIS — K5730 Diverticulosis of large intestine without perforation or abscess without bleeding: Secondary | ICD-10-CM | POA: Diagnosis not present

## 2017-05-19 DIAGNOSIS — Z86010 Personal history of colonic polyps: Secondary | ICD-10-CM | POA: Diagnosis not present

## 2017-05-19 DIAGNOSIS — D126 Benign neoplasm of colon, unspecified: Secondary | ICD-10-CM | POA: Diagnosis not present

## 2017-05-19 DIAGNOSIS — Z85038 Personal history of other malignant neoplasm of large intestine: Secondary | ICD-10-CM | POA: Diagnosis not present

## 2017-05-22 DIAGNOSIS — D126 Benign neoplasm of colon, unspecified: Secondary | ICD-10-CM | POA: Diagnosis not present

## 2017-06-04 NOTE — Patient Instructions (Addendum)
Kentley Blyden Eddinger  06/04/2017   Your procedure is scheduled on: Thursday 06/12/2017  Report to [facility] Main  Entrance              Follow signs to Short Stay on first floor at 530 AM   Call this number if you have problems the morning of surgery [phone]               PLEASE BRING CPAP MASK AND TUBING WITH YOU MORNING OF SURGERY!   Remember: Do not eat food or drink liquids :After Midnight.     Take these medicines the morning of surgery with A SIP OF WATER: Amlodipine (Norvasc), Citalopram (Celexa), Loratidine (Claritin), use Flonase nasal spray if needed                                  You may not have any metal on your body including hair pins and              piercings  Do not wear jewelry, make-up, lotions, powders or perfumes, deodorant             Do not wear nail polish.  Do not shave  48 hours prior to surgery.              Men may shave face and neck.   Do not bring valuables to the hospital. [place].  Contacts, dentures or bridgework may not be worn into surgery.  Leave suitcase in the car. After surgery it may be brought to your room.                  Please read over the following fact sheets you were given: _____________________________________________________________________             [facility] - Preparing for Surgery Before surgery, you can play an important role.  Because skin is not sterile, your skin needs to be as free of germs as possible.  You can reduce the number of germs on your skin by washing with CHG (chlorahexidine gluconate) soap before surgery.  CHG is an antiseptic cleaner which kills germs and bonds with the skin to continue killing germs even after washing. Please DO NOT use if you have an allergy to CHG or antibacterial soaps.  If your skin becomes reddened/irritated stop using the CHG and inform your nurse when you arrive at Short Stay. Do not shave  (including legs and underarms) for at least 48 hours prior to the first CHG shower.  You may shave your face/neck. Please follow these instructions carefully:  1.  Shower with CHG Soap the night before surgery and the  morning of Surgery.  2.  If you choose to wash your hair, wash your hair first as usual with your  normal  shampoo.  3.  After you shampoo, rinse your hair and body thoroughly to remove the  shampoo.                           4.  Use CHG as you would any other liquid soap.  You can apply chg directly  to the skin and wash  Gently with a scrungie or clean washcloth.  5.  Apply the CHG Soap to your body ONLY FROM THE NECK DOWN.   Do not use on face/ open                           Wound or open sores. Avoid contact with eyes, ears mouth and genitals (private parts).                       Wash face,  Genitals (private parts) with your normal soap.             6.  Wash thoroughly, paying special attention to the area where your surgery  will be performed.  7.  Thoroughly rinse your body with warm water from the neck down.  8.  DO NOT shower/wash with your normal soap after using and rinsing off  the CHG Soap.                9.  Pat yourself dry with a clean towel.            10.  Wear clean pajamas.            11.  Place clean sheets on your bed the night of your first shower and do not  sleep with pets. Day of Surgery : Do not apply any lotions/deodorants the morning of surgery.  Please wear clean clothes to the hospital/surgery center.  FAILURE TO FOLLOW THESE INSTRUCTIONS MAY RESULT IN THE CANCELLATION OF YOUR SURGERY PATIENT SIGNATURE_________________________________  NURSE SIGNATURE__________________________________  ________________________________________________________________________

## 2017-06-08 ENCOUNTER — Encounter (HOSPITAL_COMMUNITY): Payer: Self-pay | Admitting: Surgery

## 2017-06-08 DIAGNOSIS — K432 Incisional hernia without obstruction or gangrene: Secondary | ICD-10-CM | POA: Diagnosis present

## 2017-06-08 NOTE — H&P (Signed)
General Surgery [facility]  Adam Daniel DOB: Jul 04, 1945 Married / Language: English / Race: White Male   History of Present Illness  The patient is a 72 year old male who presents with an incisional hernia.  CC: incisional hernia  Patient presents today for evaluation of developing incisional hernia. Patient has previously been cared for in our practice by Dr. Jackolyn Daniel. Patient had undergone colon resection for carcinoma on March 28, 2016. Approximately 4 months ago he began to develop a small bulge near his midline abdominal incision. That has slowly increased in size and causes intermittent discomfort. Patient presents today for evaluation and recommendations for management. Patient does have photographs of the wound which I reviewed with him. This certainly appears to be an incisional hernia. Patient is due however to undergo colonoscopy at 1 year follow-up for colon cancer. Unfortunately his gastroenterologist is not in active practice. Therefore he is trying to arrange to be seen by one of the partners in that practice for follow-up colonoscopy.   Allergies  No Known Drug Allergies [02/13/2016]: Allergies Reconciled   Medication History AmLODIPine Besylate (10MG  Tablet, Oral) Active. Citalopram Hydrobromide (20MG  Tablet, Oral) Active. Rosuvastatin Calcium (5MG  Tablet, Oral) Active. Losartan Potassium (100MG  Tablet, Oral) Active. Garlic (100MG  Tablet, Oral) Active. Cinnamon (500MG  Tablet, Oral) Active. Vitamin D (Cholecalciferol) (1000UNIT Tablet, Oral) Active. Loratadine (10MG  (Rapid) Tablet, Oral) Active. Levitra (10MG  Tablet, Oral) Active. Aspirin (81MG  Tablet, Oral) Active. Centrum Silver 50+Men (Oral) Active. Glucosamine Complex (1500 Oral) Active. CoQ10 (400MG  Capsule, Oral) Active. Triamcinolone Acetonide (Top) (0.025% Cream, External) Active. Flonase (50MCG/ACT Suspension, Nasal) Active. Medications  Reconciled  Vitals Weight: 180 lb Height: 69in Body Surface Area: 1.98 m Body Mass Index: 26.58 kg/m  Temp.: 97.81F  Pulse: 73 (Regular)  BP: 122/74 (Sitting, Left Arm, Standard)  Physical Exam  See vital signs recorded above  GENERAL APPEARANCE Development: normal Nutritional status: normal Gross deformities: none  SKIN Rash, lesions, ulcers: none Induration, erythema: none Nodules: none palpable  EYES Conjunctiva and lids: normal Pupils: equal and reactive Iris: normal bilaterally  EARS, NOSE, MOUTH, THROAT External ears: no lesion or deformity External nose: no lesion or deformity Hearing: grossly normal Lips: no lesion or deformity Dentition: normal for age Oral mucosa: moist  NECK Symmetric: yes Trachea: midline Thyroid: no palpable nodules in the thyroid bed  CHEST Respiratory effort: normal Retraction or accessory muscle use: no Breath sounds: normal bilaterally Rales, rhonchi, wheeze: none  CARDIOVASCULAR Auscultation: regular rhythm, normal rate Murmurs: none Pulses: carotid and radial pulse 2+ palpable Lower extremity edema: none Lower extremity varicosities: none  ABDOMEN Distension: none Masses: none palpable Tenderness: none Hepatosplenomegaly: not present Hernia: Midline incision above level of umbilicus which is completely epithelialized. Obvious bulge to the right of midline which reduces with palpation but augments with Valsalva. Laxity of the abdominal wall to the left of midline without fascial defect.  MUSCULOSKELETAL Station and gait: normal Digits and nails: no clubbing or cyanosis Muscle strength: grossly normal all extremities Range of motion: grossly normal all extremities Deformity: none  LYMPHATIC Cervical: none palpable Supraclavicular: none palpable  PSYCHIATRIC Oriented to person, place, and time: yes Mood and affect: normal for situation Judgment and insight: appropriate for  situation    Assessment & Plan  S/P RIGHT COLECTOMY (Z90.49) INCISIONAL HERNIA, WITHOUT OBSTRUCTION OR GANGRENE (K43.2)  Follow Up - Call [facility] office after tests / studies doneto discuss further plans  Patient presents for follow-up of developing an incisional hernia. Patient had undergone colon  resection for carcinoma of little over one year ago. Over the past few months he has developed a bulge to the right of his midline abdominal incision. On examination today, this clearly represents a small incisional hernia.  Patient will require operative repair. We discussed the procedure and the options for management including open technique versus laparoscopic technique. We discussed using prosthetic mesh. We discussed the risk of recurrence following repair.  I would like to have the patient undergo his colonoscopy for follow-up of his colon cancer prior to any further operative intervention. If the colonoscopy is clean, and no further colon surgery is necessary, then I would proceed with hernia repair at a time convenient for the patient.  Patient is trying to see Dr. Wonda Daniel at the [facility] gastroenterology group. Hopefully the colonoscopy can be performed in the near future. Patient will contact our office after colonoscopy to make arrangements for incisional hernia repair.  Adam Daniel, [facility] Office: [phone]

## 2017-06-09 ENCOUNTER — Encounter (HOSPITAL_COMMUNITY): Payer: Self-pay

## 2017-06-09 ENCOUNTER — Encounter (INDEPENDENT_AMBULATORY_CARE_PROVIDER_SITE_OTHER): Payer: Self-pay

## 2017-06-09 ENCOUNTER — Encounter (HOSPITAL_COMMUNITY)
Admission: RE | Admit: 2017-06-09 | Discharge: 2017-06-09 | Disposition: A | Discharge status: Home / Self Care | Payer: Medicare Other | Source: Ambulatory Visit | Attending: Surgery | Admitting: Surgery

## 2017-06-09 ENCOUNTER — Other Ambulatory Visit: Payer: Self-pay

## 2017-06-09 DIAGNOSIS — C189 Malignant neoplasm of colon, unspecified: Secondary | ICD-10-CM | POA: Diagnosis not present

## 2017-06-09 DIAGNOSIS — Z9049 Acquired absence of other specified parts of digestive tract: Secondary | ICD-10-CM | POA: Diagnosis not present

## 2017-06-09 DIAGNOSIS — Z7982 Long term (current) use of aspirin: Secondary | ICD-10-CM | POA: Diagnosis not present

## 2017-06-09 DIAGNOSIS — K432 Incisional hernia without obstruction or gangrene: Secondary | ICD-10-CM | POA: Diagnosis not present

## 2017-06-09 DIAGNOSIS — Z79899 Other long term (current) drug therapy: Secondary | ICD-10-CM | POA: Diagnosis not present

## 2017-06-09 LAB — BASIC METABOLIC PANEL
Anion gap: 13 (ref 5–15)
BUN: 22 mg/dL — ABNORMAL HIGH (ref 6–20)
CHLORIDE: 106 mmol/L (ref 101–111)
CO2: 21 mmol/L — ABNORMAL LOW (ref 22–32)
CREATININE: 1.31 mg/dL — AB (ref 0.61–1.24)
Calcium: 9.3 mg/dL (ref 8.9–10.3)
GFR calc Af Amer: >60 mL/min (ref >60)
GFR, EST NON AFRICAN AMERICAN: 52 mL/min — AB (ref >60)
Glucose, Bld: 86 mg/dL (ref 65–99)
POTASSIUM: 4.1 mmol/L (ref 3.5–5.1)
SODIUM: 140 mmol/L (ref 135–145)

## 2017-06-09 LAB — CBC
HEMATOCRIT: 40.1 % (ref 39.0–52.0)
Hemoglobin: 13.7 g/dL (ref 13.0–17.0)
MCH: 30.8 pg (ref 26.0–34.0)
MCHC: 34.2 g/dL (ref 30.0–36.0)
MCV: 90.1 fL (ref 78.0–100.0)
PLATELETS: 290 10*3/uL (ref 150–400)
RBC: 4.45 MIL/uL (ref 4.22–5.81)
RDW: 13.6 % (ref 11.5–15.5)
WBC: 7.3 10*3/uL (ref 4.0–10.5)

## 2017-06-11 NOTE — Anesthesia Preprocedure Evaluation (Signed)
Anesthesia Evaluation  Patient identified by MRN, date of birth, ID band Patient awake    Reviewed: Allergy & Precautions, H&P , Patient's Chart, lab work & pertinent test results, reviewed documented beta blocker date and time   Airway Mallampati: II  TM Distance: >3 FB Neck ROM: full    Dental no notable dental hx.    Pulmonary    Pulmonary exam normal breath sounds clear to auscultation       Cardiovascular hypertension,  Rhythm:regular Rate:Normal     Neuro/Psych    GI/Hepatic   Endo/Other    Renal/GU      Musculoskeletal   Abdominal   Peds  Hematology   Anesthesia Other Findings   Reproductive/Obstetrics                             Anesthesia Physical Anesthesia Plan  ASA: II  Anesthesia Plan: General   Post-op Pain Management:    Induction: Intravenous  PONV Risk Score and Plan: 2 and Dexamethasone, Ondansetron and Treatment may vary due to age or medical condition  Airway Management Planned: Oral ETT  Additional Equipment:   Intra-op Plan:   Post-operative Plan: Extubation in OR  Informed Consent: I have reviewed the patients History and Physical, chart, labs and discussed the procedure including the risks, benefits and alternatives for the proposed anesthesia with the patient or authorized representative who has indicated his/her understanding and acceptance.   Dental Advisory Given  Plan Discussed with: CRNA and Surgeon  Anesthesia Plan Comments: (  )        Anesthesia Quick Evaluation

## 2017-06-12 ENCOUNTER — Encounter (HOSPITAL_COMMUNITY)
Admission: RE | Disposition: A | Discharge status: Home / Self Care | Payer: Self-pay | Source: Ambulatory Visit | Attending: Surgery

## 2017-06-12 ENCOUNTER — Ambulatory Visit (HOSPITAL_COMMUNITY): Payer: Medicare Other | Admitting: Anesthesiology

## 2017-06-12 ENCOUNTER — Encounter (HOSPITAL_COMMUNITY): Payer: Self-pay | Admitting: Emergency Medicine

## 2017-06-12 ENCOUNTER — Observation Stay (HOSPITAL_COMMUNITY)
Admission: RE | Admit: 2017-06-12 | Discharge: 2017-06-13 | Disposition: A | Discharge status: Home / Self Care | Payer: Medicare Other | Source: Ambulatory Visit | Attending: Surgery | Admitting: Surgery

## 2017-06-12 ENCOUNTER — Other Ambulatory Visit: Payer: Self-pay

## 2017-06-12 DIAGNOSIS — C189 Malignant neoplasm of colon, unspecified: Secondary | ICD-10-CM | POA: Insufficient documentation

## 2017-06-12 DIAGNOSIS — K635 Polyp of colon: Secondary | ICD-10-CM | POA: Diagnosis not present

## 2017-06-12 DIAGNOSIS — Z7982 Long term (current) use of aspirin: Secondary | ICD-10-CM | POA: Insufficient documentation

## 2017-06-12 DIAGNOSIS — Z9049 Acquired absence of other specified parts of digestive tract: Secondary | ICD-10-CM | POA: Diagnosis not present

## 2017-06-12 DIAGNOSIS — K432 Incisional hernia without obstruction or gangrene: Principal | ICD-10-CM | POA: Insufficient documentation

## 2017-06-12 DIAGNOSIS — Z79899 Other long term (current) drug therapy: Secondary | ICD-10-CM | POA: Diagnosis not present

## 2017-06-12 DIAGNOSIS — M1710 Unilateral primary osteoarthritis, unspecified knee: Secondary | ICD-10-CM | POA: Diagnosis not present

## 2017-06-12 DIAGNOSIS — I10 Essential (primary) hypertension: Secondary | ICD-10-CM | POA: Diagnosis not present

## 2017-06-12 HISTORY — PX: INSERTION OF MESH: SHX5868

## 2017-06-12 HISTORY — PX: INCISIONAL HERNIA REPAIR: SHX193

## 2017-06-12 LAB — CBC
HCT: 39.5 % (ref 39.0–52.0)
Hemoglobin: 13.3 g/dL (ref 13.0–17.0)
MCH: 30.9 pg (ref 26.0–34.0)
MCHC: 33.7 g/dL (ref 30.0–36.0)
MCV: 91.6 fL (ref 78.0–100.0)
Platelets: 275 10*3/uL (ref 150–400)
RBC: 4.31 MIL/uL (ref 4.22–5.81)
RDW: 13.5 % (ref 11.5–15.5)
WBC: 11.9 10*3/uL — ABNORMAL HIGH (ref 4.0–10.5)

## 2017-06-12 LAB — CREATININE, SERUM
CREATININE: 1.21 mg/dL (ref 0.61–1.24)
GFR, EST NON AFRICAN AMERICAN: 58 mL/min — AB (ref >60)

## 2017-06-12 SURGERY — REPAIR, HERNIA, INCISIONAL
Anesthesia: General | Site: Abdomen

## 2017-06-12 MED ORDER — FENTANYL CITRATE (PF) 100 MCG/2ML IJ SOLN
INTRAMUSCULAR | Status: DC | PRN
  Administered 2017-06-12 (×4): 50 ug via INTRAVENOUS

## 2017-06-12 MED ORDER — HYDROMORPHONE HCL 1 MG/ML IJ SOLN
1.0000 mg | INTRAMUSCULAR | Status: DC | PRN
  Administered 2017-06-12: 1 mg via INTRAVENOUS
  Filled 2017-06-12: qty 1

## 2017-06-12 MED ORDER — ONDANSETRON HCL 4 MG/2ML IJ SOLN: 4.0000 mg | Freq: Four times a day (QID) | INTRAMUSCULAR | Status: DC | PRN

## 2017-06-12 MED ORDER — PROPOFOL 10 MG/ML IV BOLUS
INTRAVENOUS | Status: AC
  Filled 2017-06-12: qty 20

## 2017-06-12 MED ORDER — HYDROCODONE-ACETAMINOPHEN 5-325 MG PO TABS
1.0000 | ORAL_TABLET | ORAL | Status: DC | PRN
  Administered 2017-06-12 (×2): 2 via ORAL
  Administered 2017-06-13: 1 via ORAL
  Administered 2017-06-13: 2 via ORAL
  Filled 2017-06-12 (×3): qty 2
  Filled 2017-06-12: qty 1

## 2017-06-12 MED ORDER — ACETAMINOPHEN 500 MG PO TABS: 1000.0000 mg | ORAL_TABLET | Freq: Four times a day (QID) | ORAL | Status: DC

## 2017-06-12 MED ORDER — ONDANSETRON HCL 4 MG/2ML IJ SOLN
INTRAMUSCULAR | Status: DC | PRN
  Administered 2017-06-12: 4 mg via INTRAVENOUS

## 2017-06-12 MED ORDER — MIDAZOLAM HCL 2 MG/2ML IJ SOLN
INTRAMUSCULAR | Status: AC
  Filled 2017-06-12: qty 2

## 2017-06-12 MED ORDER — FENTANYL CITRATE (PF) 100 MCG/2ML IJ SOLN
INTRAMUSCULAR | Status: AC
  Administered 2017-06-12: 09:00:00
  Filled 2017-06-12: qty 4

## 2017-06-12 MED ORDER — AMLODIPINE BESYLATE 10 MG PO TABS: 10.0000 mg | ORAL_TABLET | Freq: Every day | ORAL | Status: DC

## 2017-06-12 MED ORDER — FENTANYL CITRATE (PF) 100 MCG/2ML IJ SOLN
INTRAMUSCULAR | Status: AC
  Filled 2017-06-12: qty 2

## 2017-06-12 MED ORDER — KCL IN DEXTROSE-NACL 20-5-0.45 MEQ/L-%-% IV SOLN
INTRAVENOUS | Status: DC
  Administered 2017-06-12: 12:00:00 via INTRAVENOUS
  Filled 2017-06-12 (×2): qty 1000

## 2017-06-12 MED ORDER — FENTANYL CITRATE (PF) 100 MCG/2ML IJ SOLN
25.0000 ug | INTRAMUSCULAR | Status: DC | PRN
  Administered 2017-06-12 (×2): 50 ug via INTRAVENOUS

## 2017-06-12 MED ORDER — SUGAMMADEX SODIUM 200 MG/2ML IV SOLN
INTRAVENOUS | Status: AC
  Filled 2017-06-12: qty 2

## 2017-06-12 MED ORDER — EPHEDRINE 5 MG/ML INJ
INTRAVENOUS | Status: AC
  Filled 2017-06-12: qty 10

## 2017-06-12 MED ORDER — ROCURONIUM BROMIDE 10 MG/ML (PF) SYRINGE
PREFILLED_SYRINGE | INTRAVENOUS | Status: DC | PRN
  Administered 2017-06-12: 50 mg via INTRAVENOUS

## 2017-06-12 MED ORDER — BUPIVACAINE-EPINEPHRINE 0.25% -1:200000 IJ SOLN
INTRAMUSCULAR | Status: AC
  Filled 2017-06-12: qty 1

## 2017-06-12 MED ORDER — ONDANSETRON 4 MG PO TBDP
4.0000 mg | ORAL_TABLET | Freq: Four times a day (QID) | ORAL | Status: DC | PRN
Start: 2017-06-12 — End: 2017-06-13

## 2017-06-12 MED ORDER — LIDOCAINE 2% (20 MG/ML) 5 ML SYRINGE
INTRAMUSCULAR | Status: DC | PRN
  Administered 2017-06-12: 80 mg via INTRAVENOUS

## 2017-06-12 MED ORDER — CHLORHEXIDINE GLUCONATE CLOTH 2 % EX PADS: 6.0000 | MEDICATED_PAD | Freq: Once | CUTANEOUS | Status: DC

## 2017-06-12 MED ORDER — PHENYLEPHRINE 40 MCG/ML (10ML) SYRINGE FOR IV PUSH (FOR BLOOD PRESSURE SUPPORT)
PREFILLED_SYRINGE | INTRAVENOUS | Status: AC
  Filled 2017-06-12: qty 10

## 2017-06-12 MED ORDER — PHENYLEPHRINE 40 MCG/ML (10ML) SYRINGE FOR IV PUSH (FOR BLOOD PRESSURE SUPPORT)
PREFILLED_SYRINGE | INTRAVENOUS | Status: DC | PRN
  Administered 2017-06-12: 80 ug via INTRAVENOUS
  Administered 2017-06-12: 40 ug via INTRAVENOUS

## 2017-06-12 MED ORDER — SODIUM CHLORIDE 0.9 % IR SOLN
Status: DC | PRN
  Administered 2017-06-12: 1000 mL

## 2017-06-12 MED ORDER — ENOXAPARIN SODIUM 40 MG/0.4ML ~~LOC~~ SOLN
40.0000 mg | SUBCUTANEOUS | Status: DC
  Filled 2017-06-12: qty 0.4

## 2017-06-12 MED ORDER — ONDANSETRON HCL 4 MG/2ML IJ SOLN
INTRAMUSCULAR | Status: AC
  Filled 2017-06-12: qty 2

## 2017-06-12 MED ORDER — LACTATED RINGERS IV SOLN
INTRAVENOUS | Status: DC | PRN
  Administered 2017-06-12: 07:00:00 via INTRAVENOUS

## 2017-06-12 MED ORDER — DEXAMETHASONE SODIUM PHOSPHATE 10 MG/ML IJ SOLN
INTRAMUSCULAR | Status: DC | PRN
  Administered 2017-06-12: 10 mg via INTRAVENOUS

## 2017-06-12 MED ORDER — TRAMADOL HCL 50 MG PO TABS: 50.0000 mg | ORAL_TABLET | Freq: Four times a day (QID) | ORAL | Status: DC | PRN

## 2017-06-12 MED ORDER — PROPOFOL 10 MG/ML IV BOLUS
INTRAVENOUS | Status: DC | PRN
  Administered 2017-06-12: 150 mg via INTRAVENOUS

## 2017-06-12 MED ORDER — CEFAZOLIN SODIUM-DEXTROSE 2-4 GM/100ML-% IV SOLN
2.0000 g | INTRAVENOUS | Status: AC
  Administered 2017-06-12: 2 g via INTRAVENOUS
  Filled 2017-06-12: qty 100

## 2017-06-12 MED ORDER — LIDOCAINE 2% (20 MG/ML) 5 ML SYRINGE
INTRAMUSCULAR | Status: AC
  Filled 2017-06-12: qty 5

## 2017-06-12 MED ORDER — LOSARTAN POTASSIUM 50 MG PO TABS: 100.0000 mg | ORAL_TABLET | Freq: Every day | ORAL | Status: DC

## 2017-06-12 MED ORDER — SUGAMMADEX SODIUM 200 MG/2ML IV SOLN
INTRAVENOUS | Status: DC | PRN
  Administered 2017-06-12: 170 mg via INTRAVENOUS

## 2017-06-12 MED ORDER — ACETAMINOPHEN 325 MG PO TABS: 650.0000 mg | ORAL_TABLET | ORAL | Status: DC | PRN

## 2017-06-12 MED ORDER — EPHEDRINE SULFATE-NACL 50-0.9 MG/10ML-% IV SOSY
PREFILLED_SYRINGE | INTRAVENOUS | Status: DC | PRN
  Administered 2017-06-12: 10 mg via INTRAVENOUS
  Administered 2017-06-12: 5 mg via INTRAVENOUS

## 2017-06-12 MED ORDER — ACETAMINOPHEN 10 MG/ML IV SOLN
INTRAVENOUS | Status: DC | PRN
  Administered 2017-06-12: 1000 mg via INTRAVENOUS

## 2017-06-12 MED ORDER — ACETAMINOPHEN 10 MG/ML IV SOLN
INTRAVENOUS | Status: AC
  Filled 2017-06-12: qty 100

## 2017-06-12 MED ORDER — CITALOPRAM HYDROBROMIDE 20 MG PO TABS: 20.0000 mg | ORAL_TABLET | Freq: Every day | ORAL | Status: DC

## 2017-06-12 MED ORDER — ROCURONIUM BROMIDE 10 MG/ML (PF) SYRINGE
PREFILLED_SYRINGE | INTRAVENOUS | Status: AC
  Filled 2017-06-12: qty 5

## 2017-06-12 MED ORDER — FLUTICASONE PROPIONATE 50 MCG/ACT NA SUSP: 2.0000 | Freq: Every day | NASAL | Status: DC | PRN

## 2017-06-12 MED ORDER — DEXAMETHASONE SODIUM PHOSPHATE 10 MG/ML IJ SOLN
INTRAMUSCULAR | Status: AC
  Filled 2017-06-12: qty 1

## 2017-06-12 SURGICAL SUPPLY — 32 items
BINDER ABDOMINAL 12 ML 46-62 (SOFTGOODS) ×3 IMPLANT
BLADE HEX COATED 2.75 (ELECTRODE) ×3 IMPLANT
CHLORAPREP W/TINT 26ML (MISCELLANEOUS) ×3 IMPLANT
COVER SURGICAL LIGHT HANDLE (MISCELLANEOUS) ×3 IMPLANT
DRAPE LAPAROSCOPIC ABDOMINAL (DRAPES) ×3 IMPLANT
DRSG OPSITE POSTOP 4X8 (GAUZE/BANDAGES/DRESSINGS) ×3 IMPLANT
ELECT REM PT RETURN 15FT ADLT (MISCELLANEOUS) ×3 IMPLANT
GAUZE SPONGE 4X4 12PLY STRL (GAUZE/BANDAGES/DRESSINGS) ×3 IMPLANT
GLOVE BIOGEL PI IND STRL 7.0 (GLOVE) ×1 IMPLANT
GLOVE BIOGEL PI INDICATOR 7.0 (GLOVE) ×2
GLOVE SURG ORTHO 8.0 STRL STRW (GLOVE) ×3 IMPLANT
GOWN STRL REUS W/TWL LRG LVL3 (GOWN DISPOSABLE) ×3 IMPLANT
GOWN STRL REUS W/TWL XL LVL3 (GOWN DISPOSABLE) ×6 IMPLANT
KIT BASIN OR (CUSTOM PROCEDURE TRAY) ×3 IMPLANT
MARKER SKIN DUAL TIP RULER LAB (MISCELLANEOUS) ×3 IMPLANT
MESH HERNIA 6X6 BARD (Mesh General) ×1 IMPLANT
MESH HERNIA BARD 6X6 (Mesh General) ×2 IMPLANT
NS IRRIG 1000ML POUR BTL (IV SOLUTION) ×3 IMPLANT
PACK GENERAL/GYN (CUSTOM PROCEDURE TRAY) ×3 IMPLANT
SPONGE DRAIN TRACH 4X4 STRL 2S (GAUZE/BANDAGES/DRESSINGS) ×3 IMPLANT
STAPLER VISISTAT 35W (STAPLE) ×3 IMPLANT
SUT ETHILON 2 0 PS N (SUTURE) ×3 IMPLANT
SUT NOVA 1 T20/GS 25DT (SUTURE) IMPLANT
SUT NOVA NAB GS-21 0 18 T12 DT (SUTURE) ×24 IMPLANT
SUT PDS AB 1 CTX 36 (SUTURE) ×6 IMPLANT
SUT SILK 2 0 (SUTURE) ×3
SUT SILK 2-0 18XBRD TIE 12 (SUTURE) ×1 IMPLANT
SUT SILK 3 0 SH CR/8 (SUTURE) ×3 IMPLANT
SUT VIC AB 2-0 CT2 27 (SUTURE) ×3 IMPLANT
SUT VIC AB 2-0 SH 18 (SUTURE) ×3 IMPLANT
TOWEL OR 17X26 10 PK STRL BLUE (TOWEL DISPOSABLE) ×3 IMPLANT
TRAY FOLEY W/METER SILVER 16FR (SET/KITS/TRAYS/PACK) ×3 IMPLANT

## 2017-06-12 NOTE — Brief Op Note (Signed)
06/12/2017  8:45 AM  PATIENT:  Adam Daniel  72 y.o. male  PRE-OPERATIVE DIAGNOSIS:  incisional hernia  POST-OPERATIVE DIAGNOSIS:  incisional hernia  PROCEDURE:  Procedure(s): OPEN VENTRAL INCISIONAL HERNIA REPAIR WITH MESH (N/A) INSERTION OF MESH (N/A)  SURGEON:  Surgeon(s) and Role:    [name], MD - Primary  ANESTHESIA:   general  EBL:  25 mL   BLOOD ADMINISTERED:none  DRAINS: (19 Fr) Blake drain(s) in the subcutaneous   LOCAL MEDICATIONS USED:  NONE  SPECIMEN:  No Specimen  DISPOSITION OF SPECIMEN:  N/A  COUNTS:  YES  TOURNIQUET:  * No tourniquets in log *  DICTATION: .Other Dictation: Dictation Number [id number]  PLAN OF CARE: Admit for overnight observation  PATIENT DISPOSITION:  PACU - hemodynamically stable.   Delay start of Pharmacological VTE agent (>24hrs) due to surgical blood loss or risk of bleeding: yes  [name], MD [facility] Office: [phone]

## 2017-06-12 NOTE — Transfer of Care (Signed)
Immediate Anesthesia Transfer of Care Note  Patient: Adam Daniel  Procedure(s) Performed: OPEN VENTRAL INCISIONAL HERNIA REPAIR WITH MESH (N/A Abdomen) INSERTION OF MESH (N/A Abdomen)  Patient Location: PACU  Anesthesia Type:General  Level of Consciousness: awake, alert , oriented and patient cooperative  Airway & Oxygen Therapy: Patient Spontanous Breathing and Patient connected to face mask oxygen  Post-op Assessment: Report given to RN, Post -op Vital signs reviewed and stable and Patient moving all extremities  Post vital signs: Reviewed and stable  Last Vitals:  Vitals:   06/12/17 0538  BP: 136/90  Pulse: 61  Resp: 16  Temp: 36.6 C  SpO2: 96%    Last Pain:  Vitals:   06/12/17 0538  TempSrc: Oral      Patients Stated Pain Goal: 4 (85/92/92 4462)  Complications: No apparent anesthesia complications

## 2017-06-12 NOTE — Interval H&P Note (Signed)
History and Physical Interval Note:  06/12/2017 7:04 AM  Adam Daniel  has presented today for surgery, with the diagnosis of incisional hernia.  The various methods of treatment have been discussed with the patient and family. After consideration of risks, benefits and other options for treatment, the patient has consented to    Procedure(s): OPEN VENTRAL INCISIONAL HERNIA REPAIR WITH MESH (N/A) INSERTION OF MESH (N/A) as a surgical intervention .    The patient's history has been reviewed, patient examined, no change in status, stable for surgery.  I have reviewed the patient's chart and labs.  Questions were answered to the patient's satisfaction.    [name], [facility] Office: [place]

## 2017-06-12 NOTE — Anesthesia Procedure Notes (Signed)

## 2017-06-13 ENCOUNTER — Encounter (HOSPITAL_COMMUNITY): Payer: Self-pay | Admitting: Surgery

## 2017-06-13 DIAGNOSIS — Z7982 Long term (current) use of aspirin: Secondary | ICD-10-CM | POA: Diagnosis not present

## 2017-06-13 DIAGNOSIS — Z79899 Other long term (current) drug therapy: Secondary | ICD-10-CM | POA: Diagnosis not present

## 2017-06-13 DIAGNOSIS — Z9049 Acquired absence of other specified parts of digestive tract: Secondary | ICD-10-CM | POA: Diagnosis not present

## 2017-06-13 DIAGNOSIS — K432 Incisional hernia without obstruction or gangrene: Secondary | ICD-10-CM | POA: Diagnosis not present

## 2017-06-13 DIAGNOSIS — C189 Malignant neoplasm of colon, unspecified: Secondary | ICD-10-CM | POA: Diagnosis not present

## 2017-06-13 MED ORDER — HYDROCODONE-ACETAMINOPHEN 5-325 MG PO TABS: 1.0000 | ORAL_TABLET | Freq: Four times a day (QID) | ORAL | 0 refills | Status: DC | PRN

## 2017-06-13 NOTE — Progress Notes (Signed)
Discharge and medication instructions reviewed with patient and family. Questions answered and all deny further questions. Patient demonstrated care of JP drain. One prescription given to patient. Family member is driving patient home. Donne Hazel, RN

## 2017-06-13 NOTE — Discharge Summary (Signed)
Physician Discharge Summary [facility]  Patient ID: Adam Daniel MRN: [id number] DOB/AGE: 06/02/1945 72 y.o.  Admit date: 06/12/2017 Discharge date: 06/13/2017  Admission Diagnoses:  Ventral incisional hernia  Discharge Diagnoses:  Principal Problem:   Incisional hernia   Discharged Condition: good  Hospital Course: Patient was admitted for observation following incisional hernia surgery.  Post op course was uncomplicated.  Pain was well controlled.  Tolerated diet.  Patient was prepared for discharge home on POD#1.  Consults: None  Treatments: surgery: open ventral incisional hernia repair with mesh  Discharge Exam: Blood pressure (!) 114/7, pulse (!) 57, temperature 97.8 F (36.6 C), temperature source Oral, resp. rate 16, height 5\' 9"  (1.753 m), weight 81.6 kg (180 lb), SpO2 98 %. HEENT - clear Neck - soft Chest - clear bilaterally Cor - RRR Abd - wound dry and intact; binder replaced; drain with thin serosanguinous  Disposition: Home  Discharge Instructions    Diet - low sodium heart healthy   Complete by:  As directed    Discharge instructions   Complete by:  As directed    [facility] [place]  Always review your discharge instruction sheet given to you by the facility where your surgery was performed.  A  prescription for pain medication may be given to you upon discharge.  Take your pain medication as prescribed.  If narcotic pain medicine is not needed, then you may take acetaminophen (Tylenol) or ibuprofen (Advil) as needed.  Take your usually prescribed medications unless otherwise directed.  If you need a refill on your pain medication, please contact your pharmacy.  They will contact our office to request authorization. Prescriptions will not be filled after 5 pm daily or on weekends.  You should follow a light diet the first 24 hours after arrival home, such as soup and crackers or  toast.  Be sure to include plenty of fluids daily.  Resume your normal diet the day after surgery.  Most patients will experience some swelling and bruising around the surgical site.  Ice packs and reclining will help.  Swelling and bruising can take several days to resolve.   It is common to experience some constipation if taking pain medication after surgery.  Increasing fluid intake and taking a stool softener (such as Colace) will usually help or prevent this problem from occurring.  A mild laxative (Milk of Magnesia or Miralax) should be taken according to package directions if there are no bowel movements after 48 hours.  Unless discharge instructions indicate otherwise, you may remove your bandages 24-48 hours after surgery, and you may shower at that time.  You will have steri-strips (small skin tapes) in place directly over the incision.  These strips should be left on the skin for 5-7 days.  Any sutures or staples will be removed at the office during your follow-up visit.  ACTIVITIES:  You may resume regular (light) daily activities beginning the next day - such as daily self-care, walking, climbing stairs - gradually increasing activities as tolerated.  You may have sexual intercourse when it is comfortable.  Refrain from any heavy lifting or straining until approved by your doctor.  You may drive when you are no longer taking prescription pain medication, you can comfortably wear a seatbelt, and you can safely maneuver your car and apply brakes.  You should see your doctor in the office for a follow-up appointment approximately 2-3 weeks after your surgery.  Make  sure that you call for this appointment within a day or two after you arrive home to insure a convenient appointment time.   WHEN TO CALL YOUR DOCTOR: Fever greater than 101.0 Inability to urinate Persistent nausea and/or vomiting Extreme swelling or bruising Continued bleeding from incision Increased pain, redness, or  drainage from the incision  The clinic staff is available to answer your questions during regular business hours.  Please don't hesitate to call and ask to speak to one of the nurses for clinical concerns.  If you have a medical emergency, go to the nearest emergency room or call 911.  A surgeon from [facility] is always on call for the hospital.   [facility] [address] [place], [place], [place]  [address]  [phone] ? [phone] ? FAX [phone]  [email or web address]   Increase activity slowly   Complete by:  As directed    Leave dressing on - Keep it clean, dry, and intact until clinic visit   Complete by:  As directed    May begin showers tomorrow.  Leave midline dressing in place.  Replace gauze at drain site after showers.     Allergies as of 06/13/2017   No Known Allergies     Medication List    TAKE these medications   acetaminophen 325 MG tablet Commonly known as:  TYLENOL Take 650 mg by mouth every 6 (six) hours as needed for mild pain.   amLODipine 10 MG tablet Commonly known as:  NORVASC Take 10 mg by mouth daily.   aspirin EC 81 MG tablet Take 81 mg by mouth daily.   citalopram 20 MG tablet Commonly known as:  CELEXA Take 20 mg by mouth daily.   fluticasone 0.05 % cream Commonly known as:  CUTIVATE Apply 1 application topically daily as needed.   fluticasone 50 MCG/ACT nasal spray Commonly known as:  FLONASE Place 2 sprays into both nostrils daily as needed for allergies.   HYDROcodone-acetaminophen 5-325 MG tablet Commonly known as:  NORCO/VICODIN Take 1-2 tablets by mouth every 6 (six) hours as needed for moderate pain.   loratadine 10 MG tablet Commonly known as:  CLARITIN Take 10 mg by mouth daily.   losartan 100 MG tablet Commonly known as:  COZAAR Take 100 mg by mouth daily.   methocarbamol 500 MG tablet Commonly known as:  ROBAXIN Take 1 tablet (500 mg total) by mouth  every 6 (six) hours as needed for muscle spasms.   oxyCODONE 5 MG immediate release tablet Commonly known as:  Oxy IR/ROXICODONE Take 1-2 tablets (5-10 mg total) by mouth every 4 (four) hours as needed for moderate pain or severe pain.   QUNOL COQ10/UBIQUINOL/MEGA PO Take 1 capsule by mouth at bedtime.   rivaroxaban 10 MG Tabs tablet Commonly known as:  XARELTO Take 1 tablet (10 mg total) by mouth daily with breakfast. Take Xarelto for two and a half more weeks following discharge from the hospital, then discontinue Xarelto. Once the patient has completed the Xarelto, they may resume the 81 mg Aspirin.   rosuvastatin 5 MG tablet Commonly known as:  CRESTOR Take 5 mg by mouth at bedtime.      Follow-up Information    [name], MD. Schedule an appointment as soon as possible for a visit in 5 day(s).   Specialty:  General Surgery Contact information: [address] [place] [address] [phone]  Earnstine Regal, MD, [facility] Office: [phone]   Signed: Earnstine Regal 06/13/2017, 7:35 AM

## 2017-06-13 NOTE — Op Note (Signed)
NAME:  Adam Daniel, Adam Daniel.:  MEDICAL RECORD NO.:  [id number]  LOCATION: [facility]           FACILITY:  PHYSICIAN:  Earnstine Regal, MD           DATE OF BIRTH:  DATE OF PROCEDURE:  06/12/2017                              OPERATIVE REPORT   PREOPERATIVE DIAGNOSIS:  Ventral incisional hernia.  POSTOPERATIVE DIAGNOSIS:  Ventral incisional hernia.  PROCEDURE:  Open repair of ventral incisional hernia with mesh.  SURGEON:  Earnstine Regal, MD.  ANESTHESIA:  General per Dr. Lyndle Herrlich.  ESTIMATED BLOOD LOSS:  Minimal.  PREPARATION:  ChloraPrep.  COMPLICATIONS:  None.  INDICATIONS:  The patient is a 72 year old, white male, who had undergone laparoscopic-assisted colon resection for carcinoma in December 2017.  Over the past 4 to 6 months, the patient had developed a bulge near his midline abdominal incision.  This has slowly increased in size and caused discomfort.  On examination, he was found to have developed an incisional hernia.  The patient is now prepared and brought to the operating room for repair.  DESCRIPTION OF PROCEDURE:  Procedure was done in OR #5 at the [facility].  The patient was brought to the operating room, placed in a supine position on the operating room table.  Following administration of general anesthesia, the patient was prepped and draped in the usual aseptic fashion.  After ascertaining that an adequate level of anesthesia had been achieved, the patient's previous midline surgical scar was excised with a #10 blade.  Incision was extended cephalad and caudad.  Dissection was carried through subcutaneous tissues.  Fascia was identified and incised in the midline and the peritoneal cavity entered cautiously.  Adhesions to the anterior abdominal wall were taken down.  Falciform ligament is divided between [place] clamps and ligated with 2-0 silk ties.  The fascia shows numerous small defects along  the midline.  Therefore, skin flaps were developed circumferentially using Rakes.  The medial aspect of the fascia was then excised with the electrocautery back to the edge of the rectus muscles bilaterally.  The anterior rectus sheath is released anteriorly bilaterally.  Fascia was then brought together in the midline with a series of interrupted 0 Novafil simple sutures. There is good approximation of fascia without undue tension.  A 6 inch x 6 inch sheet of polypropylene mesh was then selected.  The edges were trimmed to the appropriate dimensions and the mesh was placed over the entire length of the incision and laterally over the area of the releasing incisions.  Mesh was secured to the anterior fascia with interrupted 0 Novafil simple sutures circumferentially.  A 19-French Blake drain was brought in from a stab wound in the right lower quadrant.  It was placed over the mesh in the subcutaneous space. Subcutaneous tissues were then closed with interrupted 2-0 Vicryl sutures.  Skin was closed with stainless steel staples.  Drain was placed to bulb suction.  Honey-comb dressing was applied.  Abdominal binder was applied.  The patient was awakened from anesthesia and brought to the recovery room.  The patient tolerated the procedure well.   [name], [facility] Office: [phone]    TMG/MEDQ  D:  06/12/2017  T:  06/12/2017  Job:  [id number]  cc:   Earnstine Regal, MD [address] [place] [address]

## 2017-06-22 NOTE — Anesthesia Postprocedure Evaluation (Signed)
Anesthesia Post Note  Patient: Horton A Towner  Procedure(s) Performed: OPEN VENTRAL INCISIONAL HERNIA REPAIR WITH MESH (N/A Abdomen) INSERTION OF MESH (N/A Abdomen)     Patient location during evaluation: PACU Anesthesia Type: General Level of consciousness: awake and alert Pain management: pain level controlled Vital Signs Assessment: post-procedure vital signs reviewed and stable Respiratory status: spontaneous breathing, nonlabored ventilation, respiratory function stable and patient connected to nasal cannula oxygen Cardiovascular status: blood pressure returned to baseline and stable Postop Assessment: no apparent nausea or vomiting Anesthetic complications: no    Last Vitals:  Vitals:   06/13/17 0122 06/13/17 0515  BP: 111/77 (!) 114/7  Pulse: 65 (!) 57  Resp: 16 16  Temp: 37.2 C 36.6 C  SpO2: 93% 98%    Last Pain:  Vitals:   06/13/17 0849  TempSrc:   PainSc: 1                  Azalynn Maxim EDWARD

## 2017-09-01 DIAGNOSIS — L438 Other lichen planus: Secondary | ICD-10-CM | POA: Diagnosis not present

## 2017-09-01 DIAGNOSIS — M71341 Other bursal cyst, right hand: Secondary | ICD-10-CM | POA: Diagnosis not present

## 2017-09-01 DIAGNOSIS — L821 Other seborrheic keratosis: Secondary | ICD-10-CM | POA: Diagnosis not present

## 2017-09-01 DIAGNOSIS — Z85820 Personal history of malignant melanoma of skin: Secondary | ICD-10-CM | POA: Diagnosis not present

## 2017-09-01 DIAGNOSIS — L570 Actinic keratosis: Secondary | ICD-10-CM | POA: Diagnosis not present

## 2017-09-01 DIAGNOSIS — L820 Inflamed seborrheic keratosis: Secondary | ICD-10-CM | POA: Diagnosis not present

## 2017-09-01 DIAGNOSIS — M71342 Other bursal cyst, left hand: Secondary | ICD-10-CM | POA: Diagnosis not present

## 2017-09-04 DIAGNOSIS — H00022 Hordeolum internum right lower eyelid: Secondary | ICD-10-CM | POA: Diagnosis not present

## 2017-09-09 DIAGNOSIS — H00022 Hordeolum internum right lower eyelid: Secondary | ICD-10-CM | POA: Diagnosis not present

## 2017-11-17 DIAGNOSIS — L309 Dermatitis, unspecified: Secondary | ICD-10-CM | POA: Diagnosis not present

## 2017-11-17 DIAGNOSIS — L820 Inflamed seborrheic keratosis: Secondary | ICD-10-CM | POA: Diagnosis not present

## 2017-11-17 DIAGNOSIS — Z85820 Personal history of malignant melanoma of skin: Secondary | ICD-10-CM | POA: Diagnosis not present

## 2017-11-17 DIAGNOSIS — D485 Neoplasm of uncertain behavior of skin: Secondary | ICD-10-CM | POA: Diagnosis not present

## 2017-11-17 DIAGNOSIS — D225 Melanocytic nevi of trunk: Secondary | ICD-10-CM | POA: Diagnosis not present

## 2017-11-17 DIAGNOSIS — L821 Other seborrheic keratosis: Secondary | ICD-10-CM | POA: Diagnosis not present

## 2017-12-23 DIAGNOSIS — S0512XA Contusion of eyeball and orbital tissues, left eye, initial encounter: Secondary | ICD-10-CM | POA: Diagnosis not present

## 2017-12-23 DIAGNOSIS — W19XXXA Unspecified fall, initial encounter: Secondary | ICD-10-CM | POA: Diagnosis not present

## 2017-12-23 DIAGNOSIS — S60811A Abrasion of right wrist, initial encounter: Secondary | ICD-10-CM | POA: Diagnosis not present

## 2018-01-08 DIAGNOSIS — H5211 Myopia, right eye: Secondary | ICD-10-CM | POA: Diagnosis not present

## 2018-01-08 DIAGNOSIS — H52223 Regular astigmatism, bilateral: Secondary | ICD-10-CM | POA: Diagnosis not present

## 2018-01-08 DIAGNOSIS — H524 Presbyopia: Secondary | ICD-10-CM | POA: Diagnosis not present

## 2018-01-08 DIAGNOSIS — H2513 Age-related nuclear cataract, bilateral: Secondary | ICD-10-CM | POA: Diagnosis not present

## 2018-02-25 DIAGNOSIS — Z79899 Other long term (current) drug therapy: Secondary | ICD-10-CM | POA: Diagnosis not present

## 2018-02-25 DIAGNOSIS — G4730 Sleep apnea, unspecified: Secondary | ICD-10-CM | POA: Diagnosis not present

## 2018-02-25 DIAGNOSIS — Z0000 Encounter for general adult medical examination without abnormal findings: Secondary | ICD-10-CM | POA: Diagnosis not present

## 2018-02-25 DIAGNOSIS — F325 Major depressive disorder, single episode, in full remission: Secondary | ICD-10-CM | POA: Diagnosis not present

## 2018-02-25 DIAGNOSIS — E7800 Pure hypercholesterolemia, unspecified: Secondary | ICD-10-CM | POA: Diagnosis not present

## 2018-02-25 DIAGNOSIS — I129 Hypertensive chronic kidney disease with stage 1 through stage 4 chronic kidney disease, or unspecified chronic kidney disease: Secondary | ICD-10-CM | POA: Diagnosis not present

## 2018-02-25 DIAGNOSIS — Z23 Encounter for immunization: Secondary | ICD-10-CM | POA: Diagnosis not present

## 2018-02-25 DIAGNOSIS — N183 Chronic kidney disease, stage 3 (moderate): Secondary | ICD-10-CM | POA: Diagnosis not present

## 2018-02-26 DIAGNOSIS — E7800 Pure hypercholesterolemia, unspecified: Secondary | ICD-10-CM | POA: Diagnosis not present

## 2018-02-26 DIAGNOSIS — Z79899 Other long term (current) drug therapy: Secondary | ICD-10-CM | POA: Diagnosis not present

## 2018-02-26 DIAGNOSIS — I129 Hypertensive chronic kidney disease with stage 1 through stage 4 chronic kidney disease, or unspecified chronic kidney disease: Secondary | ICD-10-CM | POA: Diagnosis not present

## 2018-02-28 DIAGNOSIS — H43812 Vitreous degeneration, left eye: Secondary | ICD-10-CM | POA: Diagnosis not present

## 2018-03-09 DIAGNOSIS — H33322 Round hole, left eye: Secondary | ICD-10-CM | POA: Diagnosis not present

## 2018-03-30 DIAGNOSIS — H33312 Horseshoe tear of retina without detachment, left eye: Secondary | ICD-10-CM | POA: Diagnosis not present

## 2018-03-30 DIAGNOSIS — H35033 Hypertensive retinopathy, bilateral: Secondary | ICD-10-CM | POA: Diagnosis not present

## 2018-03-30 DIAGNOSIS — H3589 Other specified retinal disorders: Secondary | ICD-10-CM | POA: Diagnosis not present

## 2018-03-30 DIAGNOSIS — H43813 Vitreous degeneration, bilateral: Secondary | ICD-10-CM | POA: Diagnosis not present

## 2018-04-09 DIAGNOSIS — I129 Hypertensive chronic kidney disease with stage 1 through stage 4 chronic kidney disease, or unspecified chronic kidney disease: Secondary | ICD-10-CM | POA: Diagnosis not present

## 2018-04-09 DIAGNOSIS — N183 Chronic kidney disease, stage 3 (moderate): Secondary | ICD-10-CM | POA: Diagnosis not present

## 2018-04-14 DIAGNOSIS — J069 Acute upper respiratory infection, unspecified: Secondary | ICD-10-CM | POA: Diagnosis not present

## 2018-04-23 DIAGNOSIS — H33312 Horseshoe tear of retina without detachment, left eye: Secondary | ICD-10-CM | POA: Diagnosis not present

## 2018-04-23 DIAGNOSIS — H43812 Vitreous degeneration, left eye: Secondary | ICD-10-CM | POA: Diagnosis not present

## 2018-04-23 DIAGNOSIS — H35032 Hypertensive retinopathy, left eye: Secondary | ICD-10-CM | POA: Diagnosis not present

## 2018-05-07 DIAGNOSIS — H33312 Horseshoe tear of retina without detachment, left eye: Secondary | ICD-10-CM | POA: Diagnosis not present

## 2018-05-14 DIAGNOSIS — F4320 Adjustment disorder, unspecified: Secondary | ICD-10-CM | POA: Diagnosis not present

## 2018-05-22 DIAGNOSIS — F4320 Adjustment disorder, unspecified: Secondary | ICD-10-CM | POA: Diagnosis not present

## 2018-05-29 DIAGNOSIS — F4320 Adjustment disorder, unspecified: Secondary | ICD-10-CM | POA: Diagnosis not present

## 2018-06-19 DIAGNOSIS — F4320 Adjustment disorder, unspecified: Secondary | ICD-10-CM | POA: Diagnosis not present

## 2018-06-23 DIAGNOSIS — F325 Major depressive disorder, single episode, in full remission: Secondary | ICD-10-CM | POA: Diagnosis not present

## 2018-06-23 DIAGNOSIS — I129 Hypertensive chronic kidney disease with stage 1 through stage 4 chronic kidney disease, or unspecified chronic kidney disease: Secondary | ICD-10-CM | POA: Diagnosis not present

## 2018-06-23 DIAGNOSIS — G4730 Sleep apnea, unspecified: Secondary | ICD-10-CM | POA: Diagnosis not present

## 2018-06-23 DIAGNOSIS — Z23 Encounter for immunization: Secondary | ICD-10-CM | POA: Diagnosis not present

## 2018-06-23 DIAGNOSIS — N183 Chronic kidney disease, stage 3 (moderate): Secondary | ICD-10-CM | POA: Diagnosis not present

## 2018-06-23 DIAGNOSIS — Z79899 Other long term (current) drug therapy: Secondary | ICD-10-CM | POA: Diagnosis not present

## 2018-07-03 DIAGNOSIS — F4320 Adjustment disorder, unspecified: Secondary | ICD-10-CM | POA: Diagnosis not present

## 2018-07-15 DIAGNOSIS — F4320 Adjustment disorder, unspecified: Secondary | ICD-10-CM | POA: Diagnosis not present

## 2018-07-24 DIAGNOSIS — F4320 Adjustment disorder, unspecified: Secondary | ICD-10-CM | POA: Diagnosis not present

## 2018-07-27 DIAGNOSIS — H3589 Other specified retinal disorders: Secondary | ICD-10-CM | POA: Diagnosis not present

## 2018-07-27 DIAGNOSIS — H43813 Vitreous degeneration, bilateral: Secondary | ICD-10-CM | POA: Diagnosis not present

## 2018-07-27 DIAGNOSIS — H43391 Other vitreous opacities, right eye: Secondary | ICD-10-CM | POA: Diagnosis not present

## 2018-07-27 DIAGNOSIS — H35033 Hypertensive retinopathy, bilateral: Secondary | ICD-10-CM | POA: Diagnosis not present

## 2018-07-29 DIAGNOSIS — G4733 Obstructive sleep apnea (adult) (pediatric): Secondary | ICD-10-CM | POA: Diagnosis not present

## 2018-07-29 DIAGNOSIS — I129 Hypertensive chronic kidney disease with stage 1 through stage 4 chronic kidney disease, or unspecified chronic kidney disease: Secondary | ICD-10-CM | POA: Diagnosis not present

## 2018-07-29 DIAGNOSIS — Z9989 Dependence on other enabling machines and devices: Secondary | ICD-10-CM | POA: Diagnosis not present

## 2018-07-29 DIAGNOSIS — N183 Chronic kidney disease, stage 3 (moderate): Secondary | ICD-10-CM | POA: Diagnosis not present

## 2018-08-07 ENCOUNTER — Other Ambulatory Visit: Payer: Self-pay | Admitting: Nephrology

## 2018-08-07 DIAGNOSIS — N183 Chronic kidney disease, stage 3 unspecified: Secondary | ICD-10-CM

## 2018-08-11 DIAGNOSIS — F4320 Adjustment disorder, unspecified: Secondary | ICD-10-CM | POA: Diagnosis not present

## 2018-08-12 ENCOUNTER — Ambulatory Visit
Admission: RE | Admit: 2018-08-12 | Discharge: 2018-08-12 | Disposition: A | Discharge status: Home / Self Care | Payer: Medicare Other | Source: Ambulatory Visit | Attending: Nephrology | Admitting: Nephrology

## 2018-08-12 ENCOUNTER — Other Ambulatory Visit: Payer: Self-pay

## 2018-08-12 DIAGNOSIS — N183 Chronic kidney disease, stage 3 unspecified: Secondary | ICD-10-CM

## 2018-08-13 DIAGNOSIS — N183 Chronic kidney disease, stage 3 (moderate): Secondary | ICD-10-CM | POA: Diagnosis not present

## 2018-08-13 DIAGNOSIS — I129 Hypertensive chronic kidney disease with stage 1 through stage 4 chronic kidney disease, or unspecified chronic kidney disease: Secondary | ICD-10-CM | POA: Diagnosis not present

## 2018-08-19 DIAGNOSIS — F4320 Adjustment disorder, unspecified: Secondary | ICD-10-CM | POA: Diagnosis not present

## 2018-08-24 DIAGNOSIS — H43813 Vitreous degeneration, bilateral: Secondary | ICD-10-CM | POA: Diagnosis not present

## 2018-08-24 DIAGNOSIS — H3589 Other specified retinal disorders: Secondary | ICD-10-CM | POA: Diagnosis not present

## 2018-08-24 DIAGNOSIS — H35033 Hypertensive retinopathy, bilateral: Secondary | ICD-10-CM | POA: Diagnosis not present

## 2018-08-24 DIAGNOSIS — H43391 Other vitreous opacities, right eye: Secondary | ICD-10-CM | POA: Diagnosis not present

## 2018-08-31 DIAGNOSIS — F4320 Adjustment disorder, unspecified: Secondary | ICD-10-CM | POA: Diagnosis not present

## 2018-09-10 DIAGNOSIS — F4320 Adjustment disorder, unspecified: Secondary | ICD-10-CM | POA: Diagnosis not present

## 2018-09-29 DIAGNOSIS — Z23 Encounter for immunization: Secondary | ICD-10-CM | POA: Diagnosis not present

## 2018-10-01 DIAGNOSIS — F4320 Adjustment disorder, unspecified: Secondary | ICD-10-CM | POA: Diagnosis not present

## 2018-10-05 DIAGNOSIS — H3589 Other specified retinal disorders: Secondary | ICD-10-CM | POA: Diagnosis not present

## 2018-10-05 DIAGNOSIS — H43393 Other vitreous opacities, bilateral: Secondary | ICD-10-CM | POA: Diagnosis not present

## 2018-10-05 DIAGNOSIS — H35033 Hypertensive retinopathy, bilateral: Secondary | ICD-10-CM | POA: Diagnosis not present

## 2018-10-05 DIAGNOSIS — H43813 Vitreous degeneration, bilateral: Secondary | ICD-10-CM | POA: Diagnosis not present

## 2018-10-13 DIAGNOSIS — N183 Chronic kidney disease, stage 3 (moderate): Secondary | ICD-10-CM | POA: Diagnosis not present

## 2018-10-13 DIAGNOSIS — I129 Hypertensive chronic kidney disease with stage 1 through stage 4 chronic kidney disease, or unspecified chronic kidney disease: Secondary | ICD-10-CM | POA: Diagnosis not present

## 2018-11-04 DIAGNOSIS — F4320 Adjustment disorder, unspecified: Secondary | ICD-10-CM | POA: Diagnosis not present

## 2018-11-18 DIAGNOSIS — L821 Other seborrheic keratosis: Secondary | ICD-10-CM | POA: Diagnosis not present

## 2018-11-18 DIAGNOSIS — D2272 Melanocytic nevi of left lower limb, including hip: Secondary | ICD-10-CM | POA: Diagnosis not present

## 2018-11-18 DIAGNOSIS — D225 Melanocytic nevi of trunk: Secondary | ICD-10-CM | POA: Diagnosis not present

## 2018-11-18 DIAGNOSIS — L738 Other specified follicular disorders: Secondary | ICD-10-CM | POA: Diagnosis not present

## 2018-11-18 DIAGNOSIS — D2271 Melanocytic nevi of right lower limb, including hip: Secondary | ICD-10-CM | POA: Diagnosis not present

## 2018-11-18 DIAGNOSIS — L570 Actinic keratosis: Secondary | ICD-10-CM | POA: Diagnosis not present

## 2018-11-18 DIAGNOSIS — L814 Other melanin hyperpigmentation: Secondary | ICD-10-CM | POA: Diagnosis not present

## 2018-11-18 DIAGNOSIS — Z85820 Personal history of malignant melanoma of skin: Secondary | ICD-10-CM | POA: Diagnosis not present

## 2018-11-25 DIAGNOSIS — N183 Chronic kidney disease, stage 3 (moderate): Secondary | ICD-10-CM | POA: Diagnosis not present

## 2018-12-02 DIAGNOSIS — N281 Cyst of kidney, acquired: Secondary | ICD-10-CM | POA: Diagnosis not present

## 2018-12-02 DIAGNOSIS — E559 Vitamin D deficiency, unspecified: Secondary | ICD-10-CM | POA: Diagnosis not present

## 2018-12-02 DIAGNOSIS — N183 Chronic kidney disease, stage 3 (moderate): Secondary | ICD-10-CM | POA: Diagnosis not present

## 2018-12-02 DIAGNOSIS — G4733 Obstructive sleep apnea (adult) (pediatric): Secondary | ICD-10-CM | POA: Diagnosis not present

## 2018-12-02 DIAGNOSIS — Z9989 Dependence on other enabling machines and devices: Secondary | ICD-10-CM | POA: Diagnosis not present

## 2018-12-02 DIAGNOSIS — I129 Hypertensive chronic kidney disease with stage 1 through stage 4 chronic kidney disease, or unspecified chronic kidney disease: Secondary | ICD-10-CM | POA: Diagnosis not present

## 2018-12-03 DIAGNOSIS — F4320 Adjustment disorder, unspecified: Secondary | ICD-10-CM | POA: Diagnosis not present

## 2018-12-16 DIAGNOSIS — N183 Chronic kidney disease, stage 3 (moderate): Secondary | ICD-10-CM | POA: Diagnosis not present

## 2018-12-16 DIAGNOSIS — I129 Hypertensive chronic kidney disease with stage 1 through stage 4 chronic kidney disease, or unspecified chronic kidney disease: Secondary | ICD-10-CM | POA: Diagnosis not present

## 2018-12-16 DIAGNOSIS — Z85038 Personal history of other malignant neoplasm of large intestine: Secondary | ICD-10-CM | POA: Diagnosis not present

## 2018-12-16 DIAGNOSIS — F325 Major depressive disorder, single episode, in full remission: Secondary | ICD-10-CM | POA: Diagnosis not present

## 2019-01-28 DIAGNOSIS — Z23 Encounter for immunization: Secondary | ICD-10-CM | POA: Diagnosis not present

## 2019-02-03 ENCOUNTER — Other Ambulatory Visit: Payer: Self-pay | Admitting: Nephrology

## 2019-02-03 DIAGNOSIS — N281 Cyst of kidney, acquired: Secondary | ICD-10-CM

## 2019-02-05 DIAGNOSIS — H524 Presbyopia: Secondary | ICD-10-CM | POA: Diagnosis not present

## 2019-02-05 DIAGNOSIS — H2513 Age-related nuclear cataract, bilateral: Secondary | ICD-10-CM | POA: Diagnosis not present

## 2019-02-09 ENCOUNTER — Ambulatory Visit
Admission: RE | Admit: 2019-02-09 | Discharge: 2019-02-09 | Disposition: A | Discharge status: Home / Self Care | Payer: Medicare Other | Source: Ambulatory Visit | Attending: Nephrology | Admitting: Nephrology

## 2019-02-09 DIAGNOSIS — N183 Chronic kidney disease, stage 3 unspecified: Secondary | ICD-10-CM | POA: Diagnosis not present

## 2019-02-09 DIAGNOSIS — N401 Enlarged prostate with lower urinary tract symptoms: Secondary | ICD-10-CM | POA: Diagnosis not present

## 2019-02-09 DIAGNOSIS — N281 Cyst of kidney, acquired: Secondary | ICD-10-CM | POA: Diagnosis not present

## 2019-03-11 DIAGNOSIS — Z0000 Encounter for general adult medical examination without abnormal findings: Secondary | ICD-10-CM | POA: Diagnosis not present

## 2019-03-11 DIAGNOSIS — I129 Hypertensive chronic kidney disease with stage 1 through stage 4 chronic kidney disease, or unspecified chronic kidney disease: Secondary | ICD-10-CM | POA: Diagnosis not present

## 2019-03-11 DIAGNOSIS — Z23 Encounter for immunization: Secondary | ICD-10-CM | POA: Diagnosis not present

## 2019-03-11 DIAGNOSIS — E7800 Pure hypercholesterolemia, unspecified: Secondary | ICD-10-CM | POA: Diagnosis not present

## 2019-03-11 DIAGNOSIS — F325 Major depressive disorder, single episode, in full remission: Secondary | ICD-10-CM | POA: Diagnosis not present

## 2019-03-11 DIAGNOSIS — G4730 Sleep apnea, unspecified: Secondary | ICD-10-CM | POA: Diagnosis not present

## 2019-03-11 DIAGNOSIS — N1831 Chronic kidney disease, stage 3a: Secondary | ICD-10-CM | POA: Diagnosis not present

## 2019-03-11 DIAGNOSIS — Z79899 Other long term (current) drug therapy: Secondary | ICD-10-CM | POA: Diagnosis not present

## 2019-03-25 DIAGNOSIS — Z9189 Other specified personal risk factors, not elsewhere classified: Secondary | ICD-10-CM | POA: Diagnosis not present

## 2019-06-09 IMAGING — US US RENAL
1 series · 13 of 25 positions shown · non-contrast
Comparison: None.

CLINICAL DATA: Stage III chronic renal disease

EXAM:
RENAL / URINARY TRACT ULTRASOUND COMPLETE

[Series 1: us renal · 0.23mm/px · 13 of 62 slices shown]
[im 1/62]
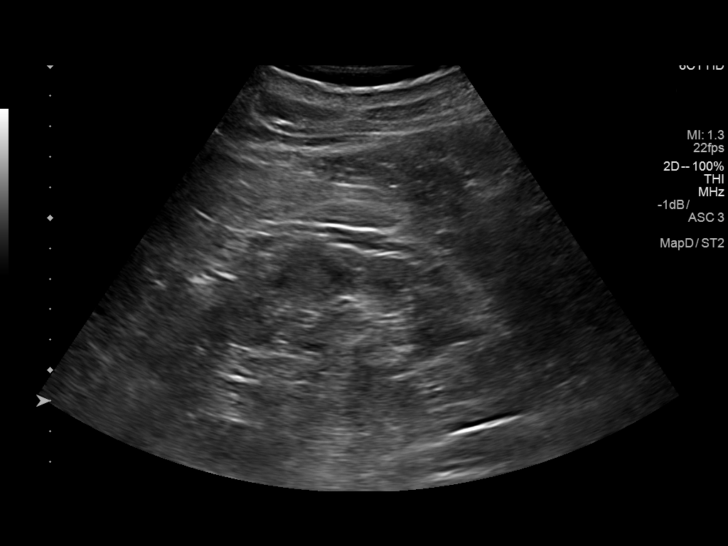
[im 6/62]
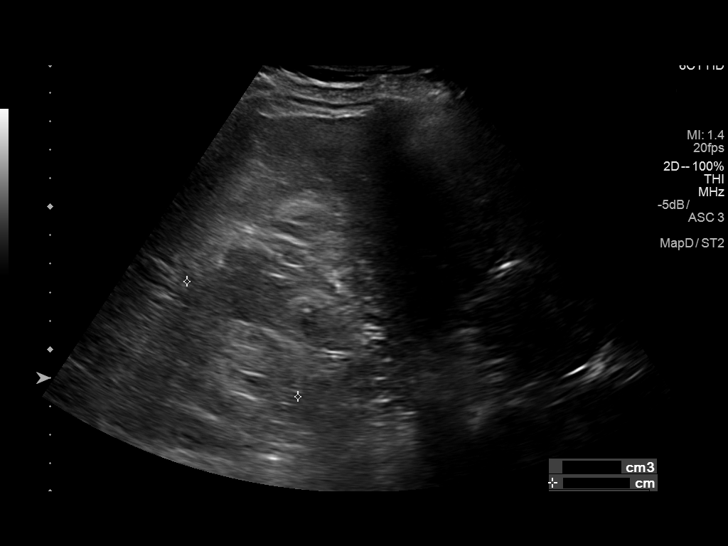
[im 11/62]
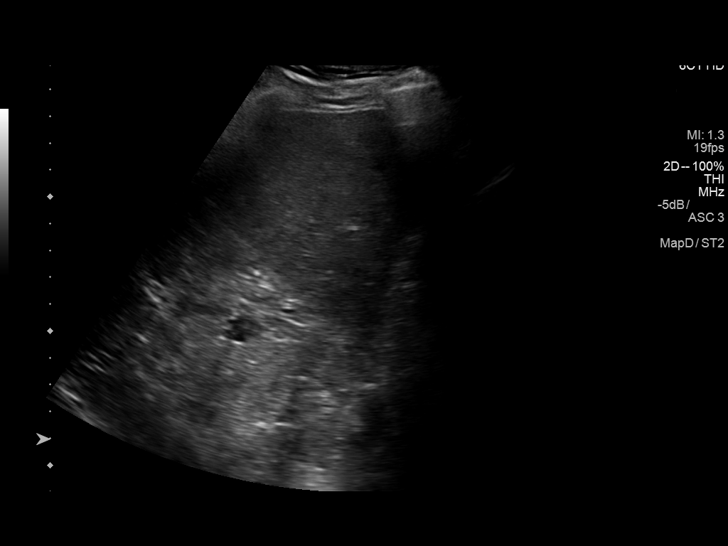
[im 16/62]
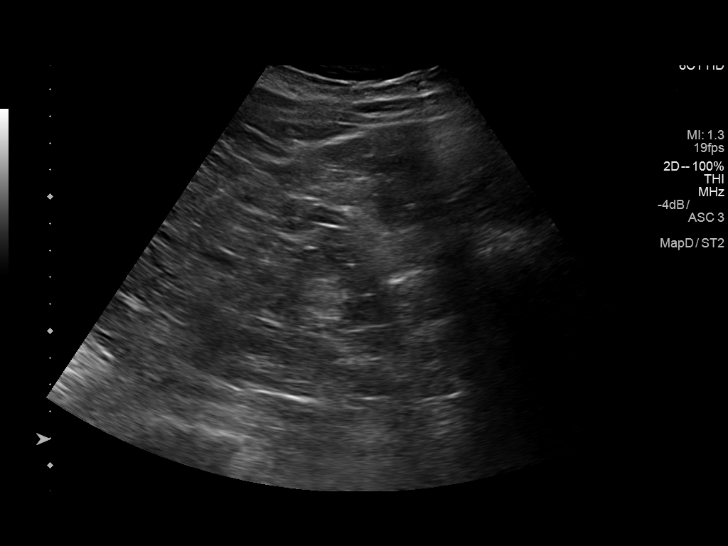
[im 21/62]
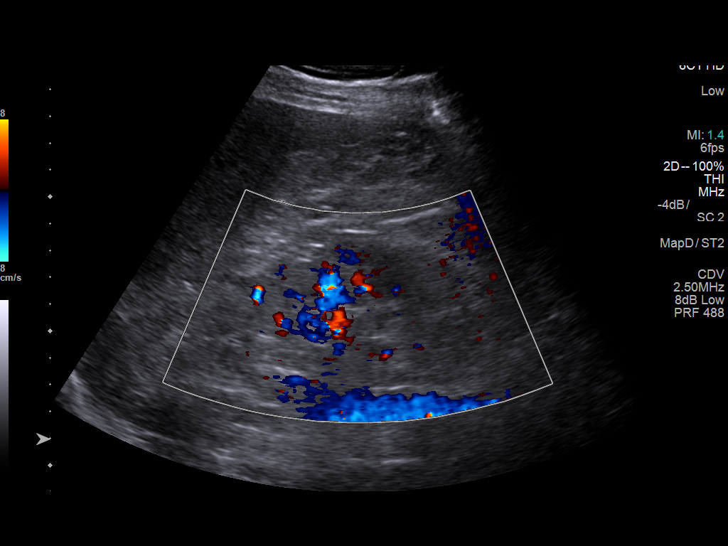
[im 26/62]
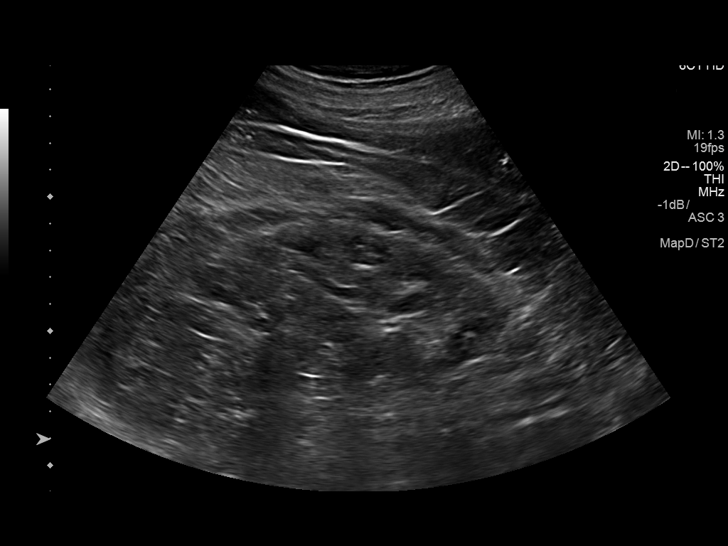
[im 31/62]
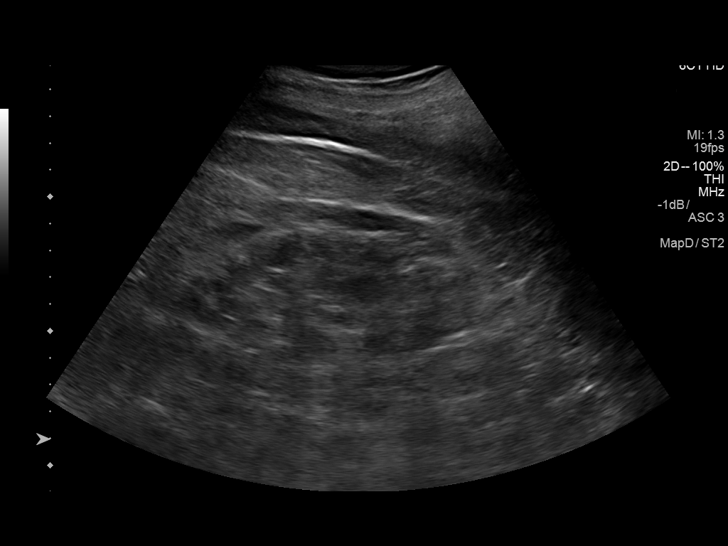
[im 36/62]
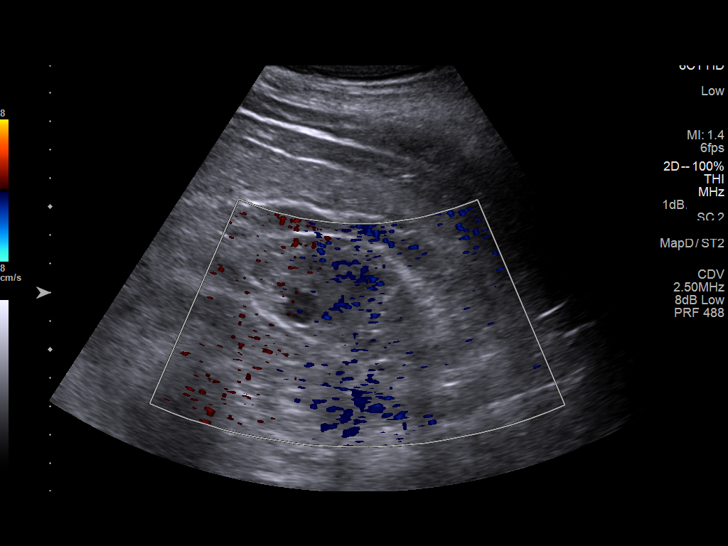
[im 41/62]
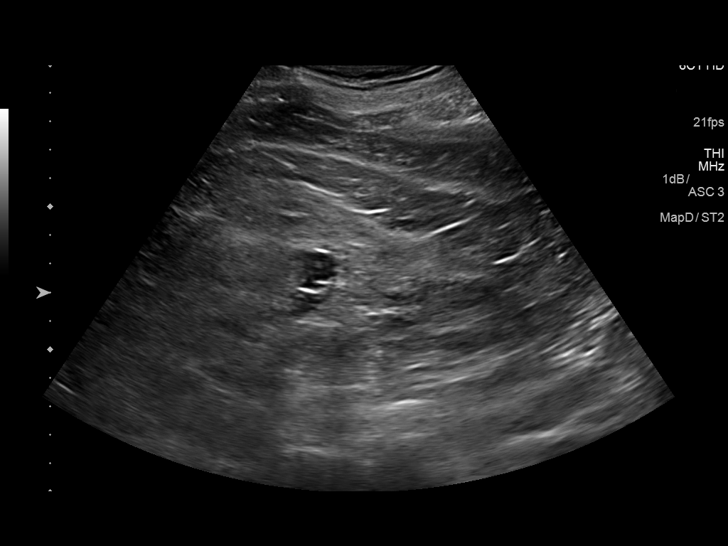
[im 46/62]
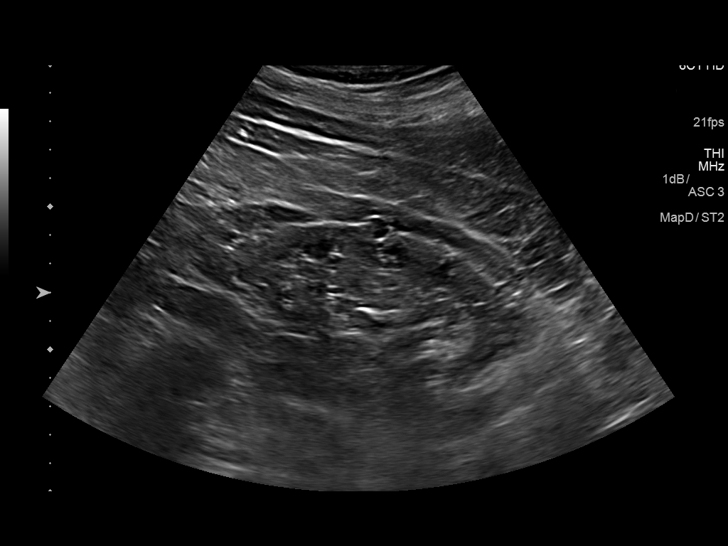
[im 51/62]
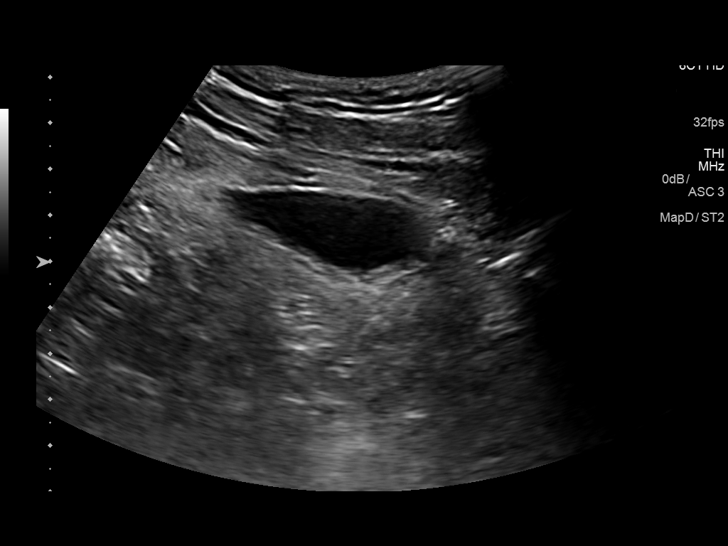
[im 56/62]
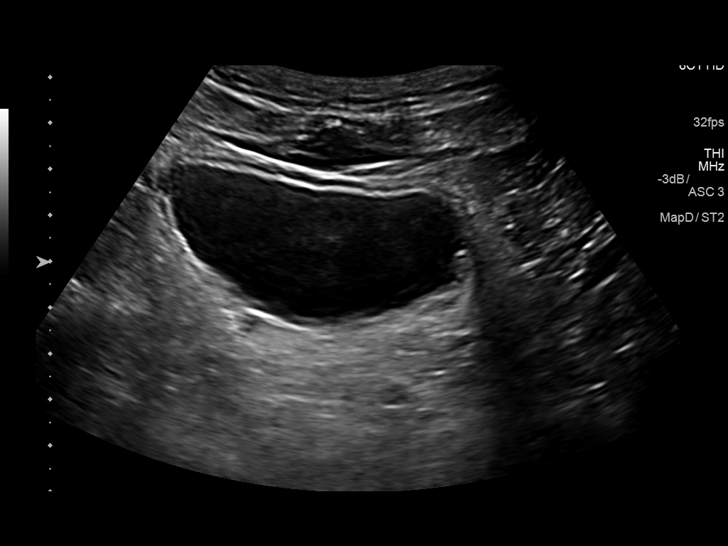
[im 62/62]
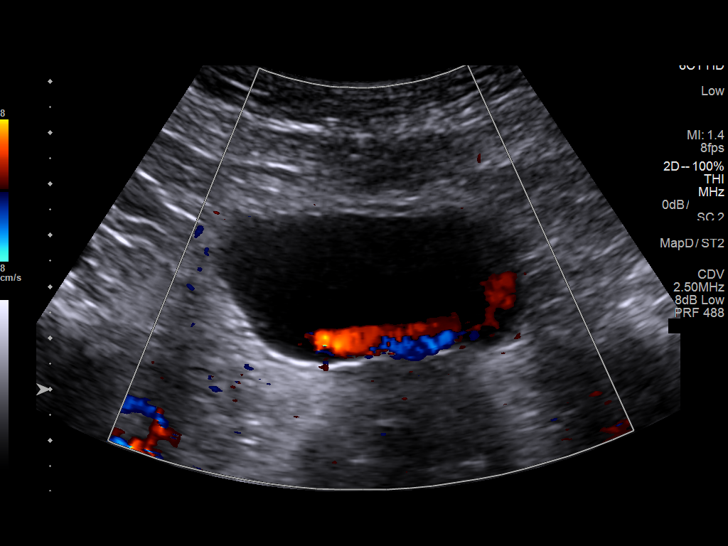

[13 of 25 positions shown; findings below may reference images not displayed]

FINDINGS: Right Kidney:

Renal measurements: 10.1 x 5.9 x 5.6 cm = volume: 175.6 mL .
Echogenicity is mildly increased. The renal cortical thickness is
normal. No perinephric fluid or hydronephrosis visualized. There is
a cyst arising from the upper pole the right kidney measuring 0.9 x
0.9 x 0.9 cm. A cyst in the lower pole region of the right kidney
measures 1.1 x 0.9 x 0.9 cm. No sonographically demonstrable
calculus or ureterectasis.

Left Kidney:

Renal measurements: 11.8 x 5.0 x 5.5 cm = volume: 168.6 mL.
Echogenicity is mildly increased. Renal cortical thickness is within
normal limits. There are several subcentimeter cysts throughout the
left kidney. There is a mildly septated cyst in the upper pole
region measuring 1.3 x 1.2 x 1.4 cm. A cyst more laterally
positioned in the left upper to mid kidney measures 1.2 x 1.4 x
cm. Other cystic areas are subcentimeter on the left. No
sonographically demonstrable calculus or ureterectasis. No mass or
hydronephrosis visualized.

Bladder:

Appears normal for degree of bladder distention. Flow from each
distal ureter is noted within the bladder.
IMPRESSION: Kidneys show mild increased echogenicity bilaterally, a finding
likely due to medical renal disease. The renal cortical thickness is
normal. No obstructing focus in each kidney. There are small cysts
in each kidney.

## 2019-07-19 DIAGNOSIS — N183 Chronic kidney disease, stage 3 unspecified: Secondary | ICD-10-CM | POA: Diagnosis not present

## 2019-07-28 DIAGNOSIS — E559 Vitamin D deficiency, unspecified: Secondary | ICD-10-CM | POA: Diagnosis not present

## 2019-07-28 DIAGNOSIS — N281 Cyst of kidney, acquired: Secondary | ICD-10-CM | POA: Diagnosis not present

## 2019-07-28 DIAGNOSIS — I129 Hypertensive chronic kidney disease with stage 1 through stage 4 chronic kidney disease, or unspecified chronic kidney disease: Secondary | ICD-10-CM | POA: Diagnosis not present

## 2019-07-28 DIAGNOSIS — G4733 Obstructive sleep apnea (adult) (pediatric): Secondary | ICD-10-CM | POA: Diagnosis not present

## 2019-07-28 DIAGNOSIS — R351 Nocturia: Secondary | ICD-10-CM | POA: Diagnosis not present

## 2019-07-28 DIAGNOSIS — N401 Enlarged prostate with lower urinary tract symptoms: Secondary | ICD-10-CM | POA: Diagnosis not present

## 2019-07-28 DIAGNOSIS — N1832 Chronic kidney disease, stage 3b: Secondary | ICD-10-CM | POA: Diagnosis not present

## 2019-07-28 DIAGNOSIS — Z9989 Dependence on other enabling machines and devices: Secondary | ICD-10-CM | POA: Diagnosis not present

## 2019-09-08 DIAGNOSIS — M25532 Pain in left wrist: Secondary | ICD-10-CM | POA: Diagnosis not present

## 2019-09-08 DIAGNOSIS — I129 Hypertensive chronic kidney disease with stage 1 through stage 4 chronic kidney disease, or unspecified chronic kidney disease: Secondary | ICD-10-CM | POA: Diagnosis not present

## 2019-09-08 DIAGNOSIS — R21 Rash and other nonspecific skin eruption: Secondary | ICD-10-CM | POA: Diagnosis not present

## 2019-09-08 DIAGNOSIS — N1831 Chronic kidney disease, stage 3a: Secondary | ICD-10-CM | POA: Diagnosis not present

## 2019-09-08 DIAGNOSIS — N401 Enlarged prostate with lower urinary tract symptoms: Secondary | ICD-10-CM | POA: Diagnosis not present

## 2019-11-10 DIAGNOSIS — D2262 Melanocytic nevi of left upper limb, including shoulder: Secondary | ICD-10-CM | POA: Diagnosis not present

## 2019-11-10 DIAGNOSIS — L821 Other seborrheic keratosis: Secondary | ICD-10-CM | POA: Diagnosis not present

## 2019-11-10 DIAGNOSIS — B070 Plantar wart: Secondary | ICD-10-CM | POA: Diagnosis not present

## 2019-11-10 DIAGNOSIS — D225 Melanocytic nevi of trunk: Secondary | ICD-10-CM | POA: Diagnosis not present

## 2019-11-10 DIAGNOSIS — L84 Corns and callosities: Secondary | ICD-10-CM | POA: Diagnosis not present

## 2019-11-10 DIAGNOSIS — D2261 Melanocytic nevi of right upper limb, including shoulder: Secondary | ICD-10-CM | POA: Diagnosis not present

## 2019-11-10 DIAGNOSIS — Z85820 Personal history of malignant melanoma of skin: Secondary | ICD-10-CM | POA: Diagnosis not present

## 2019-11-10 DIAGNOSIS — L281 Prurigo nodularis: Secondary | ICD-10-CM | POA: Diagnosis not present

## 2019-12-15 DIAGNOSIS — N401 Enlarged prostate with lower urinary tract symptoms: Secondary | ICD-10-CM | POA: Diagnosis not present

## 2019-12-15 DIAGNOSIS — F325 Major depressive disorder, single episode, in full remission: Secondary | ICD-10-CM | POA: Diagnosis not present

## 2019-12-15 DIAGNOSIS — E7800 Pure hypercholesterolemia, unspecified: Secondary | ICD-10-CM | POA: Diagnosis not present

## 2019-12-15 DIAGNOSIS — I129 Hypertensive chronic kidney disease with stage 1 through stage 4 chronic kidney disease, or unspecified chronic kidney disease: Secondary | ICD-10-CM | POA: Diagnosis not present

## 2020-01-19 DIAGNOSIS — Z23 Encounter for immunization: Secondary | ICD-10-CM | POA: Diagnosis not present

## 2020-02-08 DIAGNOSIS — N1832 Chronic kidney disease, stage 3b: Secondary | ICD-10-CM | POA: Diagnosis not present

## 2020-02-11 DIAGNOSIS — H25813 Combined forms of age-related cataract, bilateral: Secondary | ICD-10-CM | POA: Diagnosis not present

## 2020-02-11 DIAGNOSIS — H524 Presbyopia: Secondary | ICD-10-CM | POA: Diagnosis not present

## 2020-02-14 DIAGNOSIS — F325 Major depressive disorder, single episode, in full remission: Secondary | ICD-10-CM | POA: Diagnosis not present

## 2020-02-14 DIAGNOSIS — E7800 Pure hypercholesterolemia, unspecified: Secondary | ICD-10-CM | POA: Diagnosis not present

## 2020-02-14 DIAGNOSIS — N401 Enlarged prostate with lower urinary tract symptoms: Secondary | ICD-10-CM | POA: Diagnosis not present

## 2020-02-14 DIAGNOSIS — N1831 Chronic kidney disease, stage 3a: Secondary | ICD-10-CM | POA: Diagnosis not present

## 2020-02-14 DIAGNOSIS — I129 Hypertensive chronic kidney disease with stage 1 through stage 4 chronic kidney disease, or unspecified chronic kidney disease: Secondary | ICD-10-CM | POA: Diagnosis not present

## 2020-02-25 DIAGNOSIS — N281 Cyst of kidney, acquired: Secondary | ICD-10-CM | POA: Diagnosis not present

## 2020-02-25 DIAGNOSIS — Z9989 Dependence on other enabling machines and devices: Secondary | ICD-10-CM | POA: Diagnosis not present

## 2020-02-25 DIAGNOSIS — N401 Enlarged prostate with lower urinary tract symptoms: Secondary | ICD-10-CM | POA: Diagnosis not present

## 2020-02-25 DIAGNOSIS — E559 Vitamin D deficiency, unspecified: Secondary | ICD-10-CM | POA: Diagnosis not present

## 2020-02-25 DIAGNOSIS — R351 Nocturia: Secondary | ICD-10-CM | POA: Diagnosis not present

## 2020-02-25 DIAGNOSIS — E872 Acidosis: Secondary | ICD-10-CM | POA: Diagnosis not present

## 2020-02-25 DIAGNOSIS — N1832 Chronic kidney disease, stage 3b: Secondary | ICD-10-CM | POA: Diagnosis not present

## 2020-02-25 DIAGNOSIS — I129 Hypertensive chronic kidney disease with stage 1 through stage 4 chronic kidney disease, or unspecified chronic kidney disease: Secondary | ICD-10-CM | POA: Diagnosis not present

## 2020-02-25 DIAGNOSIS — G4733 Obstructive sleep apnea (adult) (pediatric): Secondary | ICD-10-CM | POA: Diagnosis not present

## 2020-02-29 ENCOUNTER — Other Ambulatory Visit: Payer: Self-pay | Admitting: Nephrology

## 2020-02-29 DIAGNOSIS — N1832 Chronic kidney disease, stage 3b: Secondary | ICD-10-CM

## 2020-02-29 DIAGNOSIS — N281 Cyst of kidney, acquired: Secondary | ICD-10-CM

## 2020-03-09 ENCOUNTER — Other Ambulatory Visit: Payer: Medicare Other

## 2020-03-14 DIAGNOSIS — D369 Benign neoplasm, unspecified site: Secondary | ICD-10-CM | POA: Diagnosis not present

## 2020-03-14 DIAGNOSIS — E7800 Pure hypercholesterolemia, unspecified: Secondary | ICD-10-CM | POA: Diagnosis not present

## 2020-03-14 DIAGNOSIS — Z0000 Encounter for general adult medical examination without abnormal findings: Secondary | ICD-10-CM | POA: Diagnosis not present

## 2020-03-14 DIAGNOSIS — Z1389 Encounter for screening for other disorder: Secondary | ICD-10-CM | POA: Diagnosis not present

## 2020-03-14 DIAGNOSIS — N1831 Chronic kidney disease, stage 3a: Secondary | ICD-10-CM | POA: Diagnosis not present

## 2020-03-14 DIAGNOSIS — G4730 Sleep apnea, unspecified: Secondary | ICD-10-CM | POA: Diagnosis not present

## 2020-03-14 DIAGNOSIS — I129 Hypertensive chronic kidney disease with stage 1 through stage 4 chronic kidney disease, or unspecified chronic kidney disease: Secondary | ICD-10-CM | POA: Diagnosis not present

## 2020-03-14 DIAGNOSIS — F325 Major depressive disorder, single episode, in full remission: Secondary | ICD-10-CM | POA: Diagnosis not present

## 2020-03-14 DIAGNOSIS — Z79899 Other long term (current) drug therapy: Secondary | ICD-10-CM | POA: Diagnosis not present

## 2020-03-23 ENCOUNTER — Ambulatory Visit
Admission: RE | Admit: 2020-03-23 | Discharge: 2020-03-23 | Disposition: A | Discharge status: Home / Self Care | Payer: Medicare Other | Source: Ambulatory Visit | Attending: Nephrology | Admitting: Nephrology

## 2020-03-23 DIAGNOSIS — N281 Cyst of kidney, acquired: Secondary | ICD-10-CM | POA: Diagnosis not present

## 2020-03-23 DIAGNOSIS — N1832 Chronic kidney disease, stage 3b: Secondary | ICD-10-CM

## 2020-03-23 DIAGNOSIS — N189 Chronic kidney disease, unspecified: Secondary | ICD-10-CM | POA: Diagnosis not present

## 2020-03-31 DIAGNOSIS — I129 Hypertensive chronic kidney disease with stage 1 through stage 4 chronic kidney disease, or unspecified chronic kidney disease: Secondary | ICD-10-CM | POA: Diagnosis not present

## 2020-03-31 DIAGNOSIS — N1831 Chronic kidney disease, stage 3a: Secondary | ICD-10-CM | POA: Diagnosis not present

## 2020-03-31 DIAGNOSIS — Z85038 Personal history of other malignant neoplasm of large intestine: Secondary | ICD-10-CM | POA: Diagnosis not present

## 2021-01-04 IMAGING — US US RENAL
1 series · 14 of 25 positions shown · non-contrast
Comparison: 02/09/2019

CLINICAL DATA: Chronic kidney disease, renal cyst

EXAM:
RENAL / URINARY TRACT ULTRASOUND COMPLETE

[Series 1: us renal · 0.23mm/px · 14 of 46 slices shown]
[im 1/46]
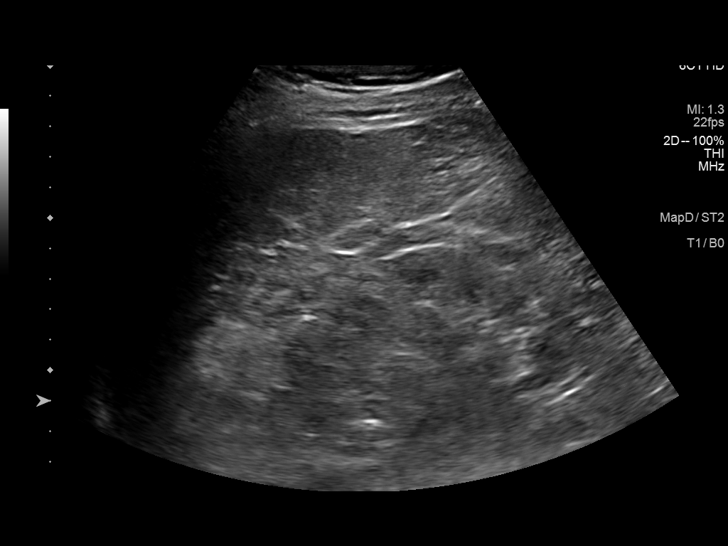
[im 4/46]
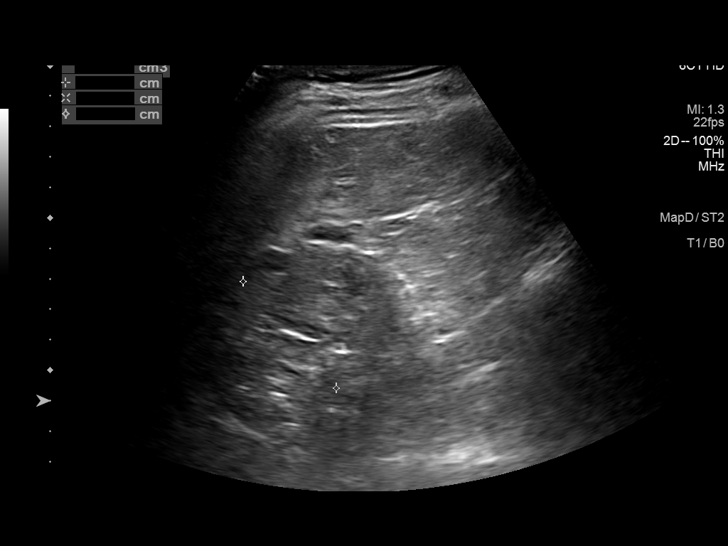
[im 8/46]
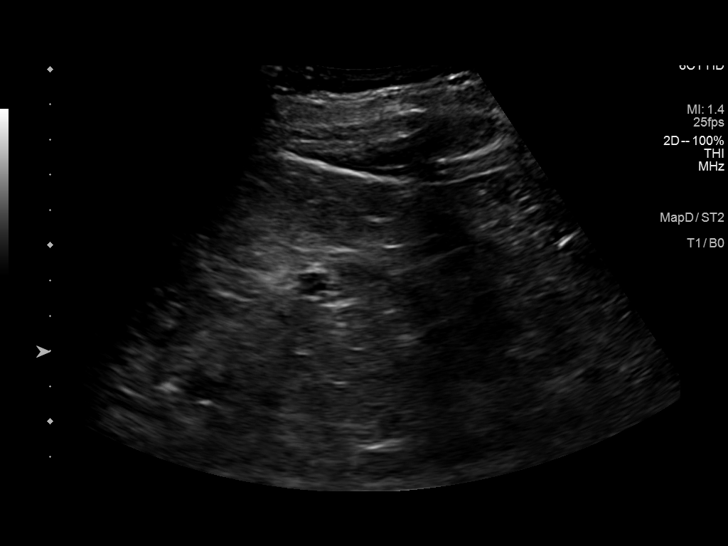
[im 12/46]
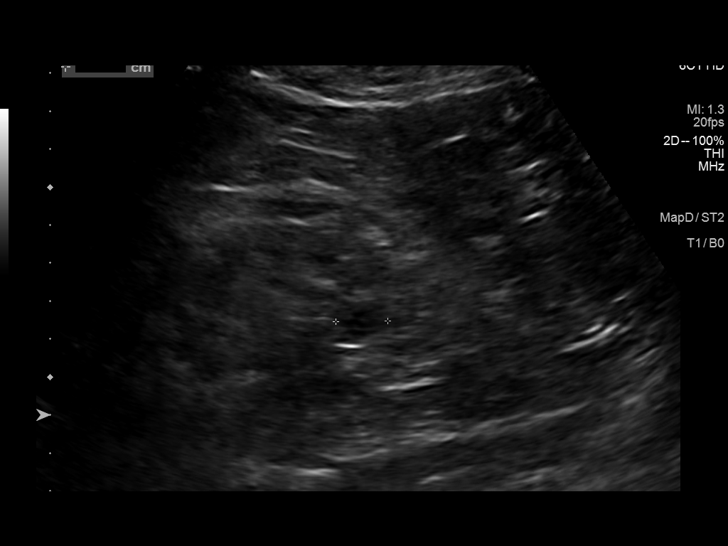
[im 16/46]
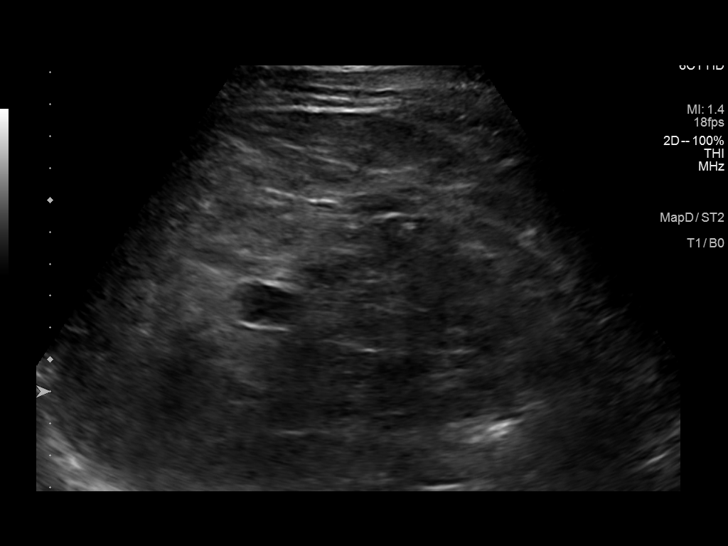
[im 17/46]
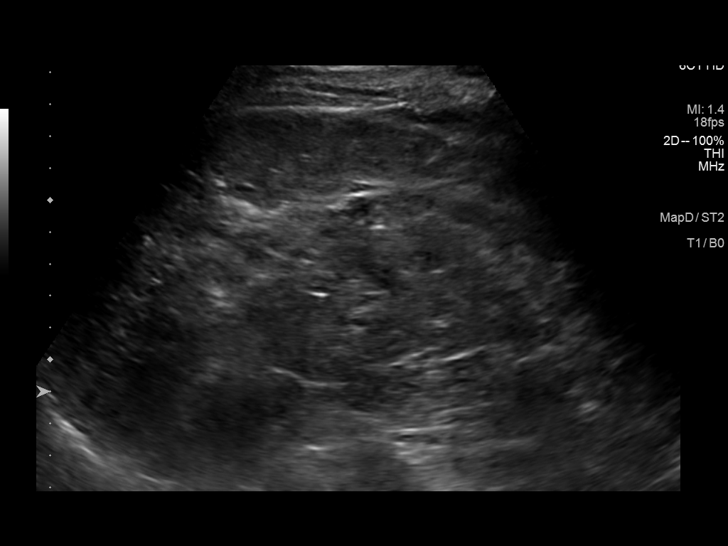
[im 21/46]
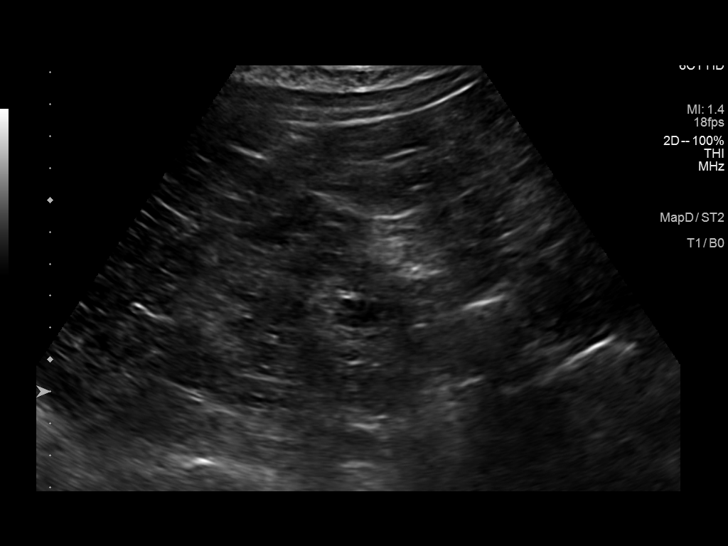
[im 25/46]
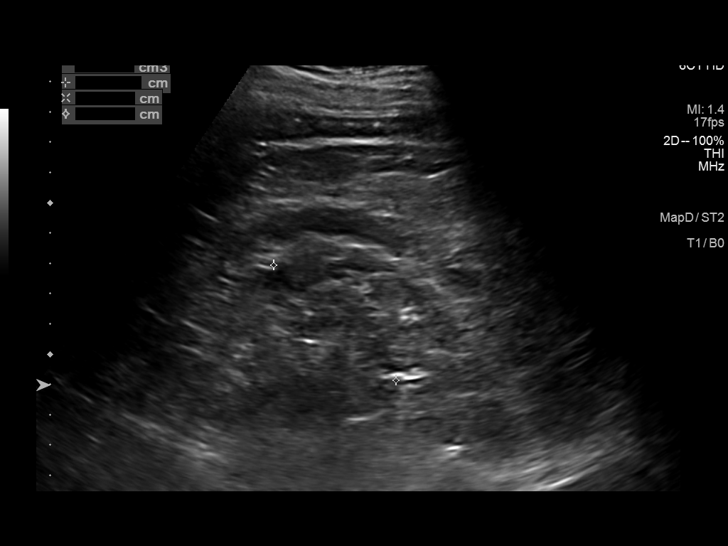
[im 29/46]
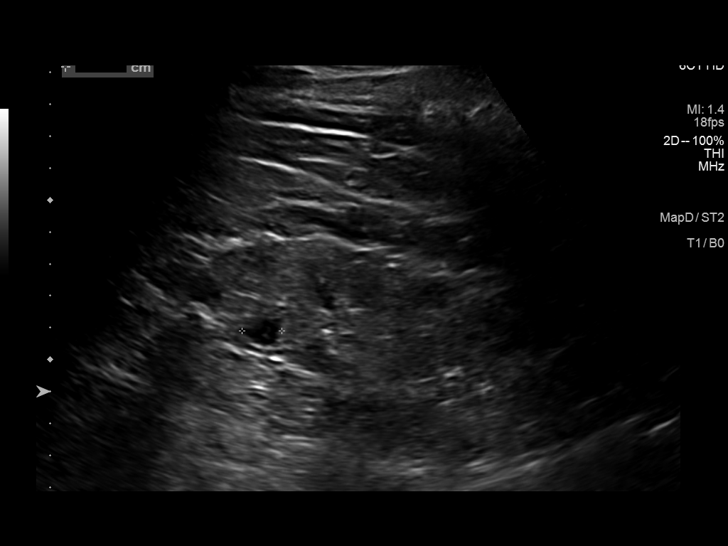
[im 31/46]
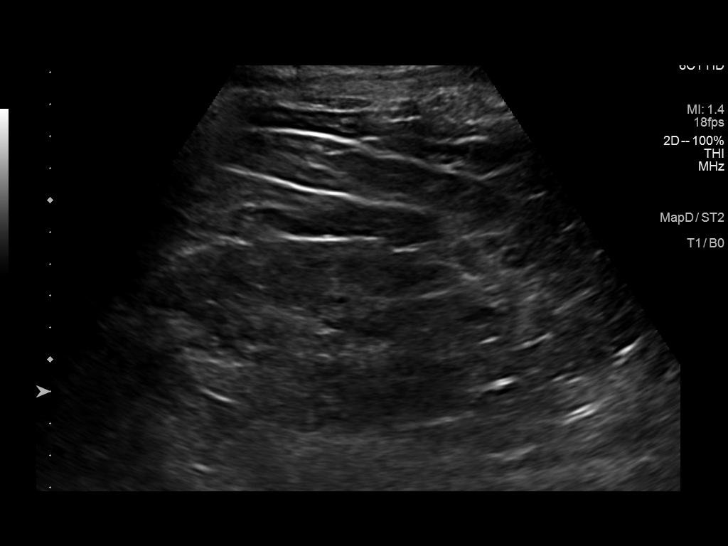
[im 34/46]
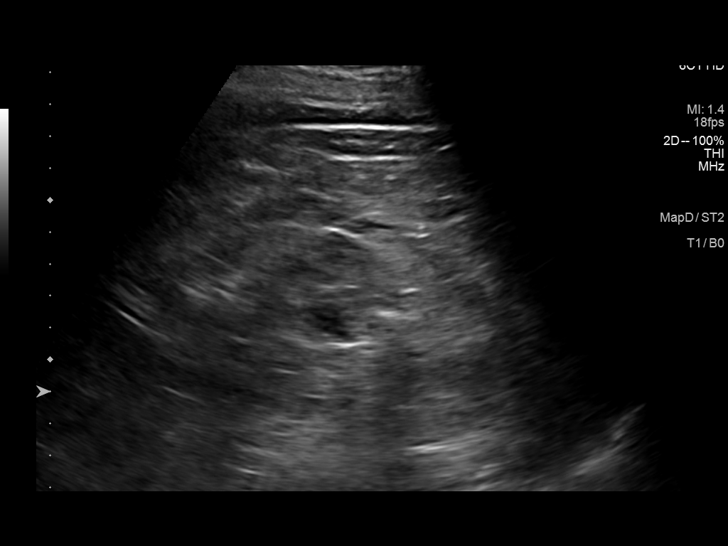
[im 38/46]
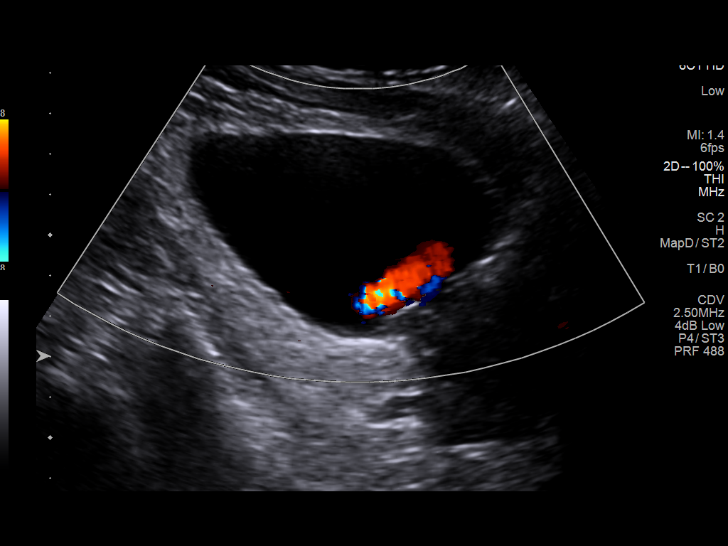
[im 42/46]
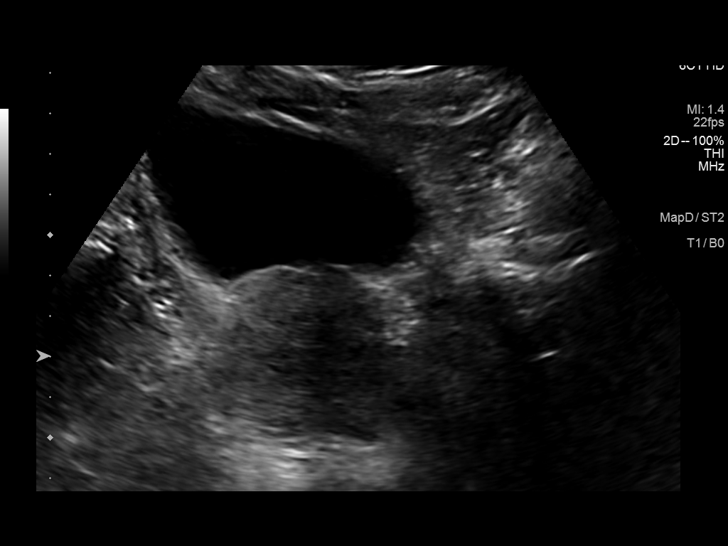
[im 46/46]
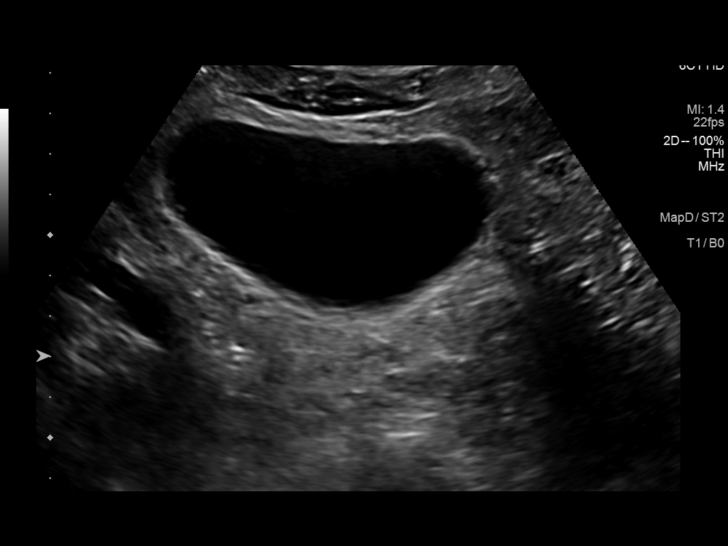

[14 of 25 positions shown; findings below may reference images not displayed]

FINDINGS: Right Kidney:

Renal measurements: 9.5 x 5.9 x 4.7 cm = volume: 136 mL. Increased
echogenicity. Small cysts measuring up to 1.8 cm. No hydronephrosis
visualized.

Left Kidney:

Renal measurements: 11.6 x 5.6 x 5.6 cm = volume: 189 mL. Increased
echogenicity. Small cysts measuring up to 1.5 cm. No hydronephrosis
visualized.

Bladder:

Appears normal for degree of bladder distention.

Other:

Prostate measures 3.8 x 4.1 x 4.7 cm.
IMPRESSION: Increased renal echogenicity reflecting medical renal disease.
Unchanged incidental bilateral cysts. No hydronephrosis.

## 2023-06-30 ENCOUNTER — Other Ambulatory Visit (HOSPITAL_COMMUNITY): Payer: Self-pay | Admitting: Internal Medicine

## 2023-06-30 DIAGNOSIS — I4891 Unspecified atrial fibrillation: Secondary | ICD-10-CM

## 2023-06-30 DIAGNOSIS — I499 Cardiac arrhythmia, unspecified: Secondary | ICD-10-CM

## 2023-07-07 ENCOUNTER — Ambulatory Visit (INDEPENDENT_AMBULATORY_CARE_PROVIDER_SITE_OTHER)

## 2023-07-07 ENCOUNTER — Encounter: Payer: Self-pay | Admitting: Cardiology

## 2023-07-07 ENCOUNTER — Ambulatory Visit: Attending: Cardiology | Admitting: Cardiology

## 2023-07-07 VITALS — BP 120/62 | HR 55 | Ht 69.0 in | Wt 186.0 lb

## 2023-07-07 DIAGNOSIS — I480 Paroxysmal atrial fibrillation: Secondary | ICD-10-CM | POA: Insufficient documentation

## 2023-07-07 DIAGNOSIS — E782 Mixed hyperlipidemia: Secondary | ICD-10-CM | POA: Insufficient documentation

## 2023-07-07 DIAGNOSIS — G4733 Obstructive sleep apnea (adult) (pediatric): Secondary | ICD-10-CM | POA: Diagnosis not present

## 2023-07-07 DIAGNOSIS — R072 Precordial pain: Secondary | ICD-10-CM | POA: Insufficient documentation

## 2023-07-07 DIAGNOSIS — I10 Essential (primary) hypertension: Secondary | ICD-10-CM | POA: Diagnosis not present

## 2023-07-07 MED ORDER — APIXABAN 2.5 MG PO TABS: 2.5000 mg | ORAL_TABLET | Freq: Two times a day (BID) | ORAL | 5 refills | Status: DC

## 2023-07-07 MED ORDER — ATORVASTATIN CALCIUM 20 MG PO TABS: 20.0000 mg | ORAL_TABLET | Freq: Every day | ORAL | 3 refills | Status: DC

## 2023-07-07 MED ORDER — APIXABAN 2.5 MG PO TABS: 2.5000 mg | ORAL_TABLET | Freq: Two times a day (BID) | ORAL | Status: AC

## 2023-07-07 NOTE — Patient Instructions (Addendum)
 Medication Instructions:  INCREASE Atorvastatin from 10 to 20 mg take one tablet by mouth daily   CONTINUING Eliquis taking 2.5 mg two tablets by mouth twice daily until 08/18/23 and then START Eliquis 2.5 mg take one tablet by mouth twice daily   *If you need a refill on your cardiac medications before your next appointment, please call your pharmacy*   Lab Work IN 3 MONTHS: Lipid panel   If you have labs (blood work) drawn today and your tests are completely normal, you will receive your results only by: MyChart Message (if you have MyChart) OR A paper copy in the mail If you have any lab test that is abnormal or we need to change your treatment, we will call you to review the results.   Testing/Procedures: 2 WEEK ZIO PATCH   Your physician has requested that you wear a Zio heart monitor for __14___ days. This will be mailed to your home with instructions on how to apply the monitor and how to return it when finished. Please allow 2 weeks after returning the heart monitor before our office calls you with the results.    Follow-Up: At [facility], you and your health needs are our priority.  As part of our continuing mission to provide you with exceptional heart care, we have created designated Provider Care Teams.  These Care Teams include your primary Cardiologist (physician) and Advanced Practice Providers (APPs -  Physician Assistants and Nurse Practitioners) who all work together to provide you with the care you need, when you need it.  Your next appointment:   6 month(s)  Provider:   Elder Negus, MD     Other Instructions Christena Deem- Long Term Monitor Instructions     Your physician has requested you wear a ZIO patch monitor for 3 days.  This is a single patch monitor. [employer] supplies one patch monitor per enrollment. Additional  stickers are not available. Please do not apply patch if you will be having a Nuclear Stress Test,  Echocardiogram, Cardiac CT,  MRI, or Chest Xray during the period you would be wearing the  monitor. The patch cannot be worn during these tests. You cannot remove and re-apply the  ZIO XT patch monitor.  Your ZIO patch monitor will be mailed 3 day USPS to your address on file. It may take 3-5 days  to receive your monitor after you have been enrolled.  Once you have received your monitor, please review the enclosed instructions. Your monitor  has already been registered assigning a specific monitor serial # to you.     Billing and Patient Assistance Program Information     We have supplied [employer] with any of your insurance information on file for billing purposes.  [employer] offers a sliding scale Patient Assistance Program for patients that do not have  insurance, or whose insurance does not completely cover the cost of the ZIO monitor.  You must apply for the Patient Assistance Program to qualify for this discounted rate.  To apply, please call [employer] at [phone], select option 4, select option 2, ask to apply for  Patient Assistance Program. Meredeth Ide will ask your household income, and how many people  are in your household. They will quote your out-of-pocket cost based on that information.  [employer] will also be able to set up a 35-month, interest-free payment plan if needed.     Applying the monitor     Shave hair from upper left chest.  Hold abrader  disc by orange tab. Rub abrader in 40 strokes over the upper left chest as  indicated in your monitor instructions.  Clean area with 4 enclosed alcohol pads. Let dry.  Apply patch as indicated in monitor instructions. Patch will be placed under collarbone on left  side of chest with arrow pointing upward.  Rub patch adhesive wings for 2 minutes. Remove white label marked "1". Remove the white  label marked "2". Rub patch adhesive wings for 2 additional minutes.  While looking in a mirror, press and release button in center of patch. A small green light will   flash 3-4 times. This will be your only indicator that the monitor has been turned on.  Do not shower for the first 24 hours. You may shower after the first 24 hours.  Press the button if you feel a symptom. You will hear a small click. Record Date, Time and  Symptom in the Patient Logbook.  When you are ready to remove the patch, follow instructions on the last 2 pages of Patient  Logbook. Stick patch monitor onto the last page of Patient Logbook.  Place Patient Logbook in the blue and white box. Use locking tab on box and tape box closed  securely. The blue and white box has prepaid postage on it. Please place it in the mailbox as  soon as possible. Your physician should have your test results approximately 7 days after the  monitor has been mailed back to [facility].  Call [facility] Customer Care at [phone] if you have questions regarding  your ZIO XT patch monitor. Call them immediately if you see an orange light blinking on your  monitor.  If your monitor falls off in less than 4 days, contact our Monitor department at [phone].  If your monitor becomes loose or falls off after 4 days call [employer] at [phone] for  suggestions on securing your monitor.

## 2023-07-07 NOTE — Progress Notes (Unsigned)
 ZIO serial # [id number] from office inventory applied to patient.

## 2023-07-07 NOTE — Progress Notes (Signed)
 Cardiology Office Note:  .   Date:  07/07/2023  ID:  Adam Daniel, DOB 1946/01/09, MRN [id number] PCP: Merlene Laughter, MD (Inactive)  [facility] Providers Cardiologist:  Truett Mainland, MD PCP: Merlene Laughter, MD (Inactive)  Chief Complaint  Patient presents with   Atrial Fibrillation      History of Present Illness: .    Adam Daniel is a 78 y.o. male with hypertension, hyperlipidemia, OSA, CKD stage 4, PAF  The patient, an almost 78 year old individual with a history of kidney disease, hypertension, and hyperlipidemia, presents for a cardiology consultation due to a recent diagnosis of atrial fibrillation (AFib). The patient reports an increase in resting heart rate from around 42 beats per minute to approximately 55 beats per minute. The patient's AFib was initially detected by his Fitbit in November and subsequently confirmed by his primary care physician via EKG. this EKG is not available for my review.  The patient reports occasional palpitations but denies any chest pain or shortness of breath.  The patient also reports a decrease in physical activity due to a replaced knee, currently engaging in 3000-5000 steps per day through walking. The patient has been prescribed Eliquis for stroke prevention due to the AFib. The patient also reports taking amlodipine for hypertension, Lipitor for hyperlipidemia, and hydralazine twice a day. The patient's daughter mentions concerns about the patient's increased tiredness and frequent urination, which she attributes to one of the patient's medications. The patient has also recently resumed using his CPAP machine for sleep apnea.    Vitals:   07/07/23 1347  BP: 120/62  Pulse: (!) 55  SpO2: 95%     ROS:  Review of Systems  Cardiovascular:  Positive for palpitations. Negative for chest pain, dyspnea on exertion, leg swelling and syncope.     Studies Reviewed: Marland Kitchen        EKG 07/07/2023: Sinus rhythm 55  bpm Right bundle branch block Left anterior fasicular block When compared with ECG of 09-Jun-2017 09:48,  rbbb, lafb are new    Independently interpreted 06/2023: Hb 15.9 Cr 2.2, eGFR 30  Independently interpreted 04/2023: Chol 205, TG 163, HDL 58, LDL 118 HbA1C 5.3% Hb 15.2 Cr 2.1 TSH 2.2    Risk Assessment/Calculations:    CHA2DS2-VASc Score = 3  This indicates a 3.2% annual risk of stroke. The patient's score is based upon: CHF History: 0 HTN History: 1 Diabetes History: 0 Stroke History: 0 Vascular Disease History: 0 Age Score: 2 Gender Score: 0      Physical Exam:   Physical Exam Vitals and nursing note reviewed.  Constitutional:      General: He is not in acute distress. Neck:     Vascular: No JVD.  Cardiovascular:     Rate and Rhythm: Normal rate and regular rhythm.     Heart sounds: Normal heart sounds. No murmur heard. Pulmonary:     Effort: Pulmonary effort is normal.     Breath sounds: Normal breath sounds. No wheezing or rales.  Musculoskeletal:     Right lower leg: No edema.     Left lower leg: No edema.      VISIT DIAGNOSES:   ICD-10-CM   1. Primary hypertension  I10 EKG 12-Lead    Lipid panel    Lipid panel    CANCELED: Lipid panel    2. Paroxysmal atrial fibrillation (HCC)  I48.0 LONG TERM MONITOR (3-14 DAYS)    Lipid panel    Lipid panel    CANCELED:  Lipid panel    3. OSA (obstructive sleep apnea)  G47.33        ASSESSMENT AND PLAN: .    Adam Daniel is a 78 y.o. male with hypertension, hyperlipidemia, OSA, CKD stage 4, PAF  PAF: Recent diagnosis.  Confirmatory EKG not available to me.  That said, this was reportedly confirmed at PCP visit. I will place him on 2-week Zio monitor to assess A-fib burden. He already has echocardiogram scheduled for later this month. Unless he develops significant symptoms associated with A-fib, or if EF is low, he probably would not need rhythm control therapy.  Rate is normal at  baseline sinus rhythm. Currently, he is on Eliquis 2.5 mg twice daily.  This will be his dose in the long-term after he turns 78, but okay to use 5 mg twice daily until then given weight >60 kg. Strongly recommend using CPAP for OSA. He is on amlodipine with very well-controlled blood pressure.  Given his recent increase in creatinine, I will probably avoid using losartan, which would be antihypertensive medication of choice in the setting of A-fib.  Mixed hyperlipidemia: LDL 118 on Lipitor 10 mg daily.  Patient is unsure if Lipitor dose was reduced in the past due to side effects.  That said, it is reasonable to increase Lipitor to modest 20 mg daily, and repeat lipid panel in 3 months.  Hypertension: Well-controlled      Meds ordered this encounter  Medications   atorvastatin (LIPITOR) 20 MG tablet    Sig: Take 1 tablet (20 mg total) by mouth daily.    Dispense:  90 tablet    Refill:  3   apixaban (ELIQUIS) 2.5 MG TABS tablet    Sig: Take 1 tablet (2.5 mg total) by mouth 2 (two) times daily.    Dispense:  60 tablet    Refill:  5   apixaban (ELIQUIS) 2.5 MG TABS tablet    Sig: Take 1 tablet (2.5 mg total) by mouth 2 (two) times daily.    Dispense:  56 tablet    Lot Number?:   [id number]    Expiration Date?:   05/21/2024     F/u in 6 months  Signed, Elder Negus, MD

## 2023-07-16 ENCOUNTER — Telehealth: Payer: Self-pay | Admitting: Cardiology

## 2023-07-16 NOTE — Telephone Encounter (Signed)
 To make it easier, lets just go ahead and make it 2.5 mg bid now itself. He turns 78 on 08/18/2023 anyway.  Thanks MJP

## 2023-07-16 NOTE — Telephone Encounter (Signed)
 Pt c/o medication issue:  1. Name of Medication: ELIQUIS 2.5 MG TABS tablet   2. How are you currently taking this medication (dosage and times per day)? As written  3. Are you having a reaction (difficulty breathing--STAT)? No  4. What is your medication issue? Pt states at his last appt his dosage would increase to 5mg  2x daily. Pharmacy calling to confirm. Please advise

## 2023-07-16 NOTE — Telephone Encounter (Signed)
 Contacted pharmacy and advised. Pharmacist states that he will contact the patient and advise plan and will work with insurance. Dr. Rosemary Holms aware.

## 2023-07-21 ENCOUNTER — Ambulatory Visit (HOSPITAL_COMMUNITY): Attending: Cardiology

## 2023-07-21 DIAGNOSIS — I499 Cardiac arrhythmia, unspecified: Secondary | ICD-10-CM | POA: Diagnosis present

## 2023-07-21 DIAGNOSIS — I4891 Unspecified atrial fibrillation: Secondary | ICD-10-CM | POA: Insufficient documentation

## 2023-07-21 LAB — ECHOCARDIOGRAM COMPLETE
Area-P 1/2: 2.83 cm2
S' Lateral: 2.7 cm

## 2023-08-02 DIAGNOSIS — I480 Paroxysmal atrial fibrillation: Secondary | ICD-10-CM | POA: Diagnosis not present

## 2023-08-02 NOTE — Progress Notes (Signed)
 No Afib noted, several episodes of SVT noted. No symptoms reported. Continue current medications. I would like to review the original diagnostic EKG showing Afib at PCP visit when he got start on Eliquis to look for any clues of possible SVT changing into Afib then.  Thanks MJP

## 2023-08-05 ENCOUNTER — Telehealth: Payer: Self-pay | Admitting: Cardiology

## 2023-08-05 NOTE — Telephone Encounter (Signed)
 Patient c/o Palpitations:  STAT if patient reporting lightheadedness, shortness of breath, or chest pain  How long have you had palpitations/irregular HR/ Afib? Are you having the symptoms now?   No  Are you currently experiencing lightheadedness, SOB or CP?   No  Do you have a history of afib (atrial fibrillation) or irregular heart rhythm?   Yes  Have you checked your BP or HR? (document readings if available):   BP 120/78 yesterday    Are you experiencing any other symptoms?   No   Daughter called to follow-up on patient's test results.  Daughter noted patient's Fit Bit is showing signs of afib and his BP has been fine.

## 2023-08-05 NOTE — Telephone Encounter (Signed)
Please review result note.

## 2023-08-27 ENCOUNTER — Encounter: Payer: Self-pay | Admitting: Cardiology

## 2023-08-27 ENCOUNTER — Other Ambulatory Visit: Payer: Self-pay

## 2023-08-27 ENCOUNTER — Ambulatory Visit: Attending: Cardiology | Admitting: Cardiology

## 2023-08-27 VITALS — BP 134/66 | HR 56 | Resp 16 | Ht 69.0 in | Wt 189.0 lb

## 2023-08-27 DIAGNOSIS — I480 Paroxysmal atrial fibrillation: Secondary | ICD-10-CM | POA: Diagnosis not present

## 2023-08-27 DIAGNOSIS — I10 Essential (primary) hypertension: Secondary | ICD-10-CM | POA: Diagnosis not present

## 2023-08-27 DIAGNOSIS — E782 Mixed hyperlipidemia: Secondary | ICD-10-CM | POA: Diagnosis not present

## 2023-08-27 LAB — LIPID PANEL
Chol/HDL Ratio: 3.2 ratio (ref 0.0–5.0)
Cholesterol, Total: 171 mg/dL (ref 100–199)
HDL: 53 mg/dL (ref >39)
LDL Chol Calc (NIH): 98 mg/dL (ref 0–99)
Triglycerides: 113 mg/dL (ref 0–149)
VLDL Cholesterol Cal: 20 mg/dL (ref 5–40)

## 2023-08-27 NOTE — Progress Notes (Signed)
 Cardiology Office Note:  .   Date:  08/27/2023  ID:  Adam Daniel, DOB 08/02/1945, MRN [id number] PCP: Benedetta Bradley, MD  [facility] Providers Cardiologist:  Fransico Ivy, MD PCP: Benedetta Bradley, MD  Chief Complaint  Patient presents with   Hypertension   Follow-up      History of Present Illness: .    Adam Daniel is a 78 y.o. male with hypertension, hyperlipidemia, OSA, CKD stage 4, PAF, PSVT  Patient is here today with his daughter.  Patient will monitor for 2 weeks.  He did not have any repeat notifications of A-fib previous..  Monitored subsequent to A-fib and showed brief episodes of PSVT.  Patient denies palpitations once, had several other episodes without symptoms when Fitbit notified him of A-fib.  Vitals:   08/27/23 0943  BP: 134/66  Pulse: (!) 56  Resp: 16  SpO2: 96%     ROS:  Review of Systems  Cardiovascular:  Positive for palpitations. Negative for chest pain, dyspnea on exertion, leg swelling and syncope.     Studies Reviewed: Aaron Aas        EKG 07/07/2023: Sinus rhythm 55 bpm Right bundle branch block Left anterior fasicular block When compared with ECG of 09-Jun-2017 09:48,  rbbb, lafb are new    Independently interpreted 06/2023: Hb 15.9 Cr 2.2, eGFR 30  Independently interpreted 04/2023: Chol 205, TG 163, HDL 58, LDL 118 HbA1C 5.3% Hb 15.2 Cr 2.1 TSH 2.2  Zio patch monitor 13 days 07/07/2023 - 07/21/2023: Dominant rhythm: Sinus. HR 39-93 bpm. Avg HR 56 bpm, in sinus rhythm w/bund,e branch block/IVCD. 123 episodes of SVT, fastest at 176 bpm for 13 beats, longest for 1 min 30 secs at 114 bpm. Artifact may limit accurate evaluation. 3% isolated SVE, <1 couplet/triplets. 0 episodes of VT. 3.4% isolated VE, <1% couplet/triplets. No atrial fibrillation/atrial flutter/VT/high grade AV block, sinus pause >3sec noted. 0 patient triggered events.   -  Risk Assessment/Calculations:    CHA2DS2-VASc Score  = 3  This indicates a 3.2% annual risk of stroke. The patient's score is based upon: CHF History: 0 HTN History: 1 Diabetes History: 0 Stroke History: 0 Vascular Disease History: 0 Age Score: 2 Gender Score: 0      Physical Exam:   Physical Exam Vitals and nursing note reviewed.  Constitutional:      General: He is not in acute distress. Neck:     Vascular: No JVD.  Cardiovascular:     Rate and Rhythm: Normal rate and regular rhythm.     Heart sounds: Normal heart sounds. No murmur heard. Pulmonary:     Effort: Pulmonary effort is normal.     Breath sounds: Normal breath sounds. No wheezing or rales.  Musculoskeletal:     Right lower leg: No edema.     Left lower leg: No edema.      VISIT DIAGNOSES:   ICD-10-CM   1. Primary hypertension  I10 Lipid panel    2. Mixed hyperlipidemia  E78.2 Lipid panel    3. Paroxysmal atrial fibrillation (HCC)  I48.0         ASSESSMENT AND PLAN: .    Adam Daniel is a 78 y.o. male with hypertension, hyperlipidemia, OSA, CKD stage 4, PAF, PSVT  PAF: Confirmatory EKG and PCP visit not available for my review.  However, there have been several repeat notifications of A-fib.  Monitor in March did not show A-fib, showed brief episodes of PSVT.  Incidentally, there  were no A-fib notifications on his Fitbit when he wore the monitor for 2 weeks. Regardless, I will treat this as paroxysmal A-fib with minimal symptoms. Resting heart rate in 50s. No rhythm control therapy necessary at this time. Continue Eliquis  2.5 mg twice daily.  Mixed hyperlipidemia: Lipitor increased to 20 mg daily at last visit.  Check lipid panel today.    Hypertension: Well-controlled on amlodipine  10 mg daily.      F/u in 6 months  Signed, Cody Das, MD

## 2023-08-27 NOTE — Patient Instructions (Signed)
 Medication Instructions:  Your physician recommends that you continue on your current medications as directed. Please refer to the Current Medication list given to you today.  *If you need a refill on your cardiac medications before your next appointment, please call your pharmacy*  Lab Work: Your physician recommends that you have lipid panel at [facility]. If you have labs (blood work) drawn today and your tests are completely normal, you will receive your results only by: MyChart Message (if you have MyChart) OR A paper copy in the mail If you have any lab test that is abnormal or we need to change your treatment, we will call you to review the results.  Testing/Procedures: None ordered today.  Follow-Up: At [facility], you and your health needs are our priority.  As part of our continuing mission to provide you with exceptional heart care, our providers are all part of one team.  This team includes your primary Cardiologist (physician) and Advanced Practice Providers or APPs (Physician Assistants and Nurse Practitioners) who all work together to provide you with the care you need, when you need it.  Your next appointment:   6 month(s)  Provider:   Cody Das, MD    We recommend signing up for the patient portal called "MyChart".  Sign up information is provided on this After Visit Summary.  MyChart is used to connect with patients for Virtual Visits (Telemedicine).  Patients are able to view lab/test results, encounter notes, upcoming appointments, etc.  Non-urgent messages can be sent to your provider as well.   To learn more about what you can do with MyChart, go to [email or web address].   Other Instructions Diet & Lifestyle recommendations:  Physical activity recommendation (The Physical Activity Guidelines for Americans. JAMA 2018;Nov 12) At least 150-300 minutes a week of moderate-intensity, or 75-150 minutes a week of vigorous-intensity aerobic  physical activity, or an equivalent combination of moderate- and vigorous-intensity aerobic activity. Adults should perform muscle-strengthening activities on 2 or more days a week. Older adults should do multicomponent physical activity that includes balance training as well as aerobic and muscle-strengthening activities. Benefits of increased physical activity include lower risk of mortality including cardiovascular mortality, lower risk of cardiovascular events and associated risk factors (hypertension and diabetes), and lower risk of many cancers (including bladder, breast, colon, endometrium, esophagus, kidney, lung, and stomach). Additional improvments have been seen in cognition, risk of dementia, anxiety and depression, improved bone health, lower risk of falls, and associated injuries.  Dietary recommendation The 2019 ACC/AHA guidelines promote nutrition as a main fixture of cardiovascular wellness, with a recommendation for a varied diet of fruit, vegetables, fish, legumes, and whole grains (Class I), as well as recommendations to reduce sodium, cholesterol, processed meats, and refined sugars (Class IIa recommendation).10 Sodium intake, a topic of some controversy as of late, is recommended to be kept at 1,500 mg/day or less, far below the average daily intake in the [place]  of 3,409 mg/day, and notably below that of previous US  recommendations for 300mg /day.10,11 For those unable to reach 1,500 mg/day, they recommend at least a reduction of 1000 mg/day.  A Pesco-Mediterranean Diet With Intermittent Fasting: JACC Review Topic of the Week. J Am Coll Cardiol 2020;76:1484-1493 Pesco-Mediterranean diet, it is supplemented with extra-virgin olive oil (EVOO), which is the principle fat source, along with moderate amounts of dairy (particularly yogurt and cheese) and eggs, as well as modest amounts of alcohol consumption (ideally red wine with the evening meal), but few red and  processed meats.

## 2023-08-28 ENCOUNTER — Telehealth: Payer: Self-pay | Admitting: Cardiology

## 2023-08-28 DIAGNOSIS — I480 Paroxysmal atrial fibrillation: Secondary | ICD-10-CM

## 2023-08-28 NOTE — Telephone Encounter (Signed)
 Reviewed EKG.  A-fib confirmed.  No action needed.  Patient is already on anticoagulation.  Cody Das, MD

## 2023-08-29 ENCOUNTER — Encounter: Payer: Self-pay | Admitting: Cardiology

## 2023-09-16 ENCOUNTER — Ambulatory Visit: Attending: Sports Medicine | Admitting: Occupational Therapy

## 2023-09-16 ENCOUNTER — Other Ambulatory Visit: Payer: Self-pay

## 2023-09-16 DIAGNOSIS — R29898 Other symptoms and signs involving the musculoskeletal system: Secondary | ICD-10-CM | POA: Diagnosis present

## 2023-09-16 DIAGNOSIS — R278 Other lack of coordination: Secondary | ICD-10-CM | POA: Insufficient documentation

## 2023-09-16 DIAGNOSIS — M25531 Pain in right wrist: Secondary | ICD-10-CM | POA: Diagnosis present

## 2023-09-16 DIAGNOSIS — M6281 Muscle weakness (generalized): Secondary | ICD-10-CM | POA: Diagnosis present

## 2023-09-16 DIAGNOSIS — M25532 Pain in left wrist: Secondary | ICD-10-CM | POA: Insufficient documentation

## 2023-09-16 NOTE — Therapy (Unsigned)
 OUTPATIENT OCCUPATIONAL THERAPY NEURO EVALUATION  Patient Name: Adam Daniel MRN: [id number] DOB:18-Oct-1945, 78 y.o., male 24 Date: 09/16/2023  PCP: Adam Bradley, MD REFERRING PROVIDER: Rance Burrows, MD  END OF SESSION:  OT End of Session - 09/16/23 1234     Visit Number 1    Number of Visits 9    Authorization Type [employer] 2025    Authorization Time Period VL: [place] Auth Rqd: [employer]    OT Start Time 1235    OT Stop Time 1355    OT Time Calculation (min) 80 min    Equipment Utilized During Treatment Testing Material    Activity Tolerance Patient tolerated treatment well    Behavior During Therapy WFL for tasks assessed/performed             Past Medical History:  Diagnosis Date   Arthritis    right knee    Bradycardia    Cancer (HCC)    melanoma on right shoulder , skin cancer    Chronic kidney disease    stage III   Complication of anesthesia    memory problems   Hypertension    Plaque psoriasis    Pneumonia    hx of years ago    Psoriasis    Sleep apnea    cpap - heated machine 9 setting    Past Surgical History:  Procedure Laterality Date   COLONOSCOPY     INCISIONAL HERNIA REPAIR N/A 06/12/2017   Procedure: OPEN VENTRAL INCISIONAL HERNIA REPAIR WITH MESH;  Surgeon: Adam Billow, MD;  Location: [facility];  Service: General;  Laterality: N/A;   INSERTION OF MESH N/A 06/12/2017   Procedure: INSERTION OF MESH;  Surgeon: Adam Billow, MD;  Location: [facility];  Service: General;  Laterality: N/A;   JOINT REPLACEMENT     right total knee   LAPAROSCOPIC PARTIAL COLECTOMY N/A 03/28/2016   Procedure: LAPAROSCOPIC ASSISTED  EXTENDED RIGHT COLECTOMY;  Surgeon: Adam Hollow, MD;  Location: [facility];  Service: General;  Laterality: N/A;   melanoma removed from right shoulder      TOTAL KNEE ARTHROPLASTY Right 01/27/2017   Procedure: RIGHT TOTAL KNEE ARTHROPLASTY;  Surgeon: Adam Rei, MD;  Location: [facility];  Service: Orthopedics;  Laterality:  Right;   Patient Active Problem List   Diagnosis Date Noted   Primary hypertension 08/27/2023   Precordial pain 07/07/2023   Paroxysmal atrial fibrillation (HCC) 07/07/2023   OSA (obstructive sleep apnea) 07/07/2023   Mixed hyperlipidemia 07/07/2023   Incisional hernia 06/08/2017   OA (osteoarthritis) of knee 01/27/2017   Dysplastic polyp of colon 03/28/2016    ONSET DATE: 08/22/2023  REFERRING DIAG:  B14.782 (ICD-10-CM) - Pain in left wrist  M25.531 (ICD-10-CM) - Pain in right wrist  THERAPY DIAG:  Pain in left wrist  Pain in right wrist  Muscle weakness (generalized)  Other symptoms and signs involving the musculoskeletal system  Rationale for Evaluation and Treatment: Rehabilitation  SUBJECTIVE:   SUBJECTIVE STATEMENT: Patient goes by "[name]"  Pt reports that his hands hurt and that he has trouble with his strength ie) ripping opening things, opening bottles etc.  He does also have a trigger finger on R little finger mostly.  Pt finds relief form pain for short duration with use of Voltaren gel.  Pt accompanied by: self and family member - Adam Daniel  PERTINENT HISTORY:  PMHx: Hypertension, Cervical disc disease, Depression, psoriasis, Melanoma r shoulder 2012, DJD right knee bone-on-bone, stage III renal disease 06/2018, Sleep apnea,  2 cm adenocarcinoma colon resected 03/2016 repeat colon 04/2017 done Adam Daniel repeat done 05/2020 small adenoma, 10/2016 1mm cyst tendon left 1st finger, retinal flashing and laser treatment [facility]  Musculoskeletal: Positive for bilateral wrist pain, worse in right wrist. Pain localized to back of wrist. Positive for crunching sensation in wrist. Positive for difficulty opening pinky finger (possible trigger finger)  Severe thumb carpometacarpal, triscaphe, hamate-triquetrum,capitate-lunate and radial-scaphoid osteoarthritis. Probable remote triquetral avulsion fracture.  X-rays reveal osteoarthritis in both wrists,  with the left wrist showing more severe changes including joint space narrowing and extra bone growth in the ulna. The right wrist demonstrates some irregularity and decreased joint space. Pain is localized to the back of the wrist, with no radiation to the fingers.  Plan:- Refer to hand physical therapy for wrist strengthening exercises- Follow-up appointment in 10-12 weeks- Consider corticosteroid injections if pain becomes severe (informed patient of potential side effects including tendon weakness)            PRECAUTIONS: None  WEIGHT BEARING RESTRICTIONS: No  PAIN: Wrist feels pretty good right now. Are you having pain? Yes: NPRS scale: 5 Pain location: Wrists and trigger finger R little finger Pain description: aching Aggravating factors: using hands/wrists Relieving factors: massaging wrists, warming them up, voltaren  FALLS: Has patient fallen in last 6 months? No  LIVING ENVIRONMENT: Pt and wife (who has Alzheimer's) recently bought a new home and moved in with his daughter and family Lives with: lives with their family and lives with their daughter, granddaughter 15 yo Lives in: House/apartment Stairs: Yes: External: 3 steps; on right going up in the garage and bilateral but cannot reach both at the front - tends to use the garage Has following equipment at home: shower chair, Grab bars, and walk in shower, raised toilet seat  PLOF: Independent Married 57 years, wife has Alzheimer and he was caring for her until they recently moved in with their daughter and family.  Pt has 2 daughters.  Pt has not been driving lately.  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  HAND DOMINANCE: Left - ambidextrous  ADLs: Overall ADLs: Mod Ind Transfers/ambulation related to ADLs: Ind Eating: Ind Grooming: Ind UB  Dressing: Ind LB Dressing: Ind Toileting: Using elevated seat Bathing: Mod Ind Tub Shower transfers: Mod Ind Equipment: Grab bars, Walk in shower, and built in seat as well as raised toilet seat  IADLs: Shopping: Goes on "field trips" with his daughter Light housekeeping: TBD Meal Prep: daughter - will reheat food - microwave/stove Community mobility: Ind Medication management: Ind and wife's meds and weekly pill box Financial management: Ind Handwriting: TBA  MOBILITY STATUS: Independent  POSTURE COMMENTS:  rounded shoulders and forward head Sitting balance: WFL  ACTIVITY TOLERANCE: Activity tolerance: Good  FUNCTIONAL OUTCOME MEASURES: TBD  UPPER EXTREMITY ROM:   BUE - WFL  UPPER EXTREMITY MMT:     BUE - 4+/5  HAND FUNCTION: Grip strength: Right: 49.8, 55, 56.2  lbs; Left: 60.4, 56.8, 59.0, 64.1 lbs Average: Right 53.7 lbs Left 60.1 lbs "Crunchy noises" with BUEs with squeezing  COORDINATION: 9 Hole Peg test: Right: 23.75 sec; Left: 23.75 sec  SENSATION: WFL  EDEMA: wrists swell sometimes  COGNITION: Overall cognitive status: Within functional limits for tasks assessed  OBSERVATIONS: Pt ambulates without AE and no loss of  balance. The pt is well kept and was accompanied by his daughter who reports some improvement in his pain since moving in with them as he does not have to do as much to help with taking care of the home or his spouse. Pt and daughter were attentive with daughter reiterating education back to OT/father throughout session. Pt had audible crunching in hands when conducting grip strength testing today also.                                                                                                                            TODAY'S TREATMENT:    Therapeutic Exercises: Pt issued tendon gliding exercises/handout with review of motions to isolate DIP, PIP and MCP joints for straight finger position, hook (DIP/PIP flexion), fist (DIP/PIP/MCP  flexion), taco/duck (MCP flexion only) and flat fist (MCP and PIP flexion). Eduction provided re: purpose of these ROM exercises including to increase the circulation to the hand and wrist, reduce swelling and promote healthier soft tissue for increased motion but not to build hand or wrist strength at this time.   Self Care: OT introduced joint protection principles as needed to improve UE pain. Patient encouraged to protect hands and wrist by  - Respecting for Pain and stopping activities before they reach the point of discomfort or pain  - Rest and Work Balance ie) balancing activities with appropriate rests during activity - Reduction of Effort - Use two hands instead of one if possible  - Use of Larger/Stronger Joints Ie) Lift or carry with the forearm or shoulder rather than fingers - Use of Assistive Equipment - Consider splint use and AE equipment to protect joints from deformity and stresses Then reviewed activities that can be modified with adaptive equipment and encourage patient to look adaptive equipment for arthritis [i.e. to consider joint protection].   PATIENT EDUCATION: Education details: OT role, POC considerations, tendon glides and joint protection Person educated: Patient and Child(ren) Education method: Explanation, Demonstration, Tactile cues, Verbal cues, and Handouts Education comprehension: verbalized understanding, returned demonstration, verbal cues required, tactile cues required, and needs further education  HOME EXERCISE PROGRAM: 09/16/23: Tendon Gliding Exercises   GOALS: Goals reviewed with patient? Yes - will review specific goals at next appt    SHORT TERM GOALS: Target date: 10/20/23   Patient will demonstrate initial BUE HEP with 25% verbal cues or less for proper execution. Baseline: New to outpt OT Goal status: IN Progress - Tendon Gliding Exc issued at eval.   2.  Pt to trial prefab hand splints to help improve pain and improve overall comfort  for functional use of RUE. Recommend prefab vs fab due to chronicity of issue, adjustability, comfort, and ease of cleaning. Baseline: No splints Goal status: INITIAL   3.  Pt will independently recall at least 3 joint protection, ergonomics, and body mechanic principles as noted in pt instructions to assist with daily tasks with increased comfort and confidence.  Baseline:  New  to outpt OT Goal status: IN Progress - Introduced at eval   4.  Patient will demonstrate no loss in grip strength and increased confidence in his grip as needed to open jars and other containers. Baseline: Right 53.7 lbs Left 60.1 lbs  Goal status: INITIAL   5.  Pt will independently recall sleep positioning options as noted in pt instructions to minimize sleep disturbances to minimal.  Baseline:  New to outpt OT Goal status: INITIAL     LONG TERM GOALS: Target date: 11/20/23   Patient will demonstrate updated BUE HEP with visual instruction only for proper execution. Baseline: New to outpt OT Goal status: IN Progress - Tendon Gliding Exc issued at eval.   2.  Pt will independently recall at least 3 options for adaptive equipment to assist with joint protection, ergonomics, and body mechanic principles as noted in pt instructions for ADLs and IADLS.  Baseline:  New to outpt OT Goal status: INITIAL   3.  Pt will be independent with home based pain management routine to potentially include gloves/splints, heat and joint protection principles for minimal pain <3/10 Baseline: 5/10 Goal status: INITIAL   4.  Patient will demonstrate at least 16% improvement with quick Dash score (reporting TBD% disability or less) indicating improved functional use of affected extremity.  Baseline: QuickDash TBD Goal status: INITIAL   ASSESSMENT:  CLINICAL IMPRESSION: Patient is a 78 y.o. male who was seen today for occupational therapy evaluation for B wrist pain, determined to be OA changes of bilateral wrists L>R and  bilateral CMC OA changes as well. Hx includes Hypertension, Cervical disc disease, Depression. Patient currently presents below baseline level of functioning demonstrating functional deficits and impairments as noted below. Pt would benefit from skilled OT services in the outpatient setting to work on impairments as noted below to help pt return to PLOF as able.    PERFORMANCE DEFICITS: in functional skills including ADLs, IADLs, coordination, dexterity, edema, ROM, strength, pain, flexibility, Fine motor control, Gross motor control, body mechanics, endurance, decreased knowledge of precautions, decreased knowledge of use of DME, and UE functional use, cognitive skills including problem solving, and psychosocial skills including coping strategies, environmental adaptation, habits, and routines and behaviors.   IMPAIRMENTS: are limiting patient from ADLs, IADLs, rest and sleep, leisure, and social participation.   CO-MORBIDITIES: may have co-morbidities  that affects occupational performance. Patient will benefit from skilled OT to address above impairments and improve overall function.  MODIFICATION OR ASSISTANCE TO COMPLETE EVALUATION: Min-Moderate modification of tasks or assist with assess necessary to complete an evaluation.  OT OCCUPATIONAL PROFILE AND HISTORY: Detailed assessment: Review of Daniel and additional review of physical, cognitive, psychosocial history related to current functional performance.  CLINICAL DECISION MAKING: Moderate - several treatment options, min-mod task modification necessary  REHAB POTENTIAL: Good  EVALUATION COMPLEXITY: Moderate    PLAN:  OT FREQUENCY: 1x/week - per pt/family request  OT DURATION: 8 weeks  PLANNED INTERVENTIONS: 97535 self care/ADL training, 19147 therapeutic exercise, 97530 therapeutic activity, 97112 neuromuscular re-education, 97140 manual therapy, 97035 ultrasound, 97018 paraffin, 82956 fluidotherapy, 97010 moist heat, 97760  Orthotic Initial, 97763 Orthotic/Prosthetic subsequent, energy conservation, coping strategies training, patient/family education, and DME and/or AE instructions  RECOMMENDED OTHER SERVICES: NA  CONSULTED AND AGREED WITH PLAN OF CARE: Patient and family member/caregiver  PLAN FOR NEXT SESSION:   Review tendon glides Provide soft putty as appropriate Joint protection education/handouts Splint options Modalities for pain management Redo Quick Freddi Jaeger, OT  09/16/2023, 4:48 PM

## 2023-09-18 NOTE — Patient Instructions (Signed)
 Adam Daniel

## 2023-10-02 ENCOUNTER — Ambulatory Visit: Attending: Sports Medicine | Admitting: Occupational Therapy

## 2023-10-02 DIAGNOSIS — R29898 Other symptoms and signs involving the musculoskeletal system: Secondary | ICD-10-CM | POA: Insufficient documentation

## 2023-10-02 DIAGNOSIS — M25531 Pain in right wrist: Secondary | ICD-10-CM | POA: Insufficient documentation

## 2023-10-02 DIAGNOSIS — M25532 Pain in left wrist: Secondary | ICD-10-CM | POA: Diagnosis present

## 2023-10-02 DIAGNOSIS — R278 Other lack of coordination: Secondary | ICD-10-CM | POA: Insufficient documentation

## 2023-10-02 DIAGNOSIS — M6281 Muscle weakness (generalized): Secondary | ICD-10-CM | POA: Insufficient documentation

## 2023-10-02 NOTE — Therapy (Signed)
 OUTPATIENT OCCUPATIONAL THERAPY NEURO TREATMENT  Patient Name: Adam Daniel MRN: [id number] DOB:1946/01/22, 78 y.o., male Today's Date: 10/02/2023  PCP: Benedetta Bradley, MD REFERRING PROVIDER: Rance Burrows, MD  END OF SESSION:  OT End of Session - 10/02/23 1317     Visit Number 2    Number of Visits 9    Date for OT Re-Evaluation 11/11/23    Authorization Type [employer] Medicare 2025 VL: [place] Auth Rqd: [employer]    Authorization Time Period 09/16/23 - 11/11/23    Authorization - Visit Number 2    Authorization - Number of Visits 6    OT Start Time 1317    OT Stop Time 1400    OT Time Calculation (min) 43 min    Equipment Utilized During Treatment Splints    Activity Tolerance Patient tolerated treatment well    Behavior During Therapy WFL for tasks assessed/performed          Past Medical History:  Diagnosis Date   Arthritis    right knee    Bradycardia    Cancer (HCC)    melanoma on right shoulder , skin cancer    Chronic kidney disease    stage III   Complication of anesthesia    memory problems   Hypertension    Plaque psoriasis    Pneumonia    hx of years ago    Psoriasis    Sleep apnea    cpap - heated machine 9 setting    Past Surgical History:  Procedure Laterality Date   COLONOSCOPY     INCISIONAL HERNIA REPAIR N/A 06/12/2017   Procedure: OPEN VENTRAL INCISIONAL HERNIA REPAIR WITH MESH;  Surgeon: Oralee Billow, MD;  Location: [facility];  Service: General;  Laterality: N/A;   INSERTION OF MESH N/A 06/12/2017   Procedure: INSERTION OF MESH;  Surgeon: Oralee Billow, MD;  Location: [facility];  Service: General;  Laterality: N/A;   JOINT REPLACEMENT     right total knee   LAPAROSCOPIC PARTIAL COLECTOMY N/A 03/28/2016   Procedure: LAPAROSCOPIC ASSISTED  EXTENDED RIGHT COLECTOMY;  Surgeon: Adalberto Hollow, MD;  Location: [facility];  Service: General;  Laterality: N/A;   melanoma removed from right shoulder      TOTAL KNEE ARTHROPLASTY Right 01/27/2017   Procedure:  RIGHT TOTAL KNEE ARTHROPLASTY;  Surgeon: Liliane Rei, MD;  Location: [facility];  Service: Orthopedics;  Laterality: Right;   Patient Active Problem List   Diagnosis Date Noted   Primary hypertension 08/27/2023   Precordial pain 07/07/2023   Paroxysmal atrial fibrillation (HCC) 07/07/2023   OSA (obstructive sleep apnea) 07/07/2023   Mixed hyperlipidemia 07/07/2023   Incisional hernia 06/08/2017   OA (osteoarthritis) of knee 01/27/2017   Dysplastic polyp of colon 03/28/2016    ONSET DATE: 08/22/2023  REFERRING DIAG:  W29.562 (ICD-10-CM) - Pain in left wrist  M25.531 (ICD-10-CM) - Pain in right wrist  THERAPY DIAG:  Pain in right wrist  Muscle weakness (generalized)  Other lack of coordination  Other symptoms and signs involving the musculoskeletal system  Pain in left wrist  Rationale for Evaluation and Treatment: Rehabilitation  SUBJECTIVE:   SUBJECTIVE STATEMENT: Patient goes by Adam Daniel  Pt reported that his right wrist was feeling a little achy today and that the L pinkie finger has been triggering on occasion - especially at night. He doesn't use the Voltaren gel all too often and notices his pain can be related to upcoming weather/storms.    Pt reported the education we did at  eval is sticking ie) he found himself holding a larger object in a hug position rather than holding it with tight hand grip.     Pt accompanied by: self   PERTINENT HISTORY:  PMHx: Hypertension, Cervical disc disease, Depression, psoriasis, Melanoma r shoulder 2012, DJD right knee bone-on-bone, stage III renal disease 06/2018, Sleep apnea, 2 cm adenocarcinoma colon resected 03/2016 repeat colon 04/2017 done Dr Kimble Pennant repeat done 05/2020 small adenoma, 10/2016 1mm cyst tendon left 1st finger, retinal flashing and laser treatment Dr [facility]  Musculoskeletal: Positive for bilateral wrist pain, worse in right wrist. Pain localized to back of wrist. Positive for crunching sensation in wrist.  Positive for difficulty opening pinky finger (possible trigger finger)  Severe thumb carpometacarpal, triscaphe, hamate-triquetrum,capitate-lunate and radial-scaphoid osteoarthritis. Probable remote triquetral avulsion fracture.  X-rays reveal osteoarthritis in both wrists, with the left wrist showing more severe changes including joint space narrowing and extra bone growth in the ulna. The right wrist demonstrates some irregularity and decreased joint space. Pain is localized to the back of the wrist, with no radiation to the fingers.  Plan:- Refer to hand physical therapy for wrist strengthening exercises- Follow-up appointment in 10-12 weeks- Consider corticosteroid injections if pain becomes severe (informed patient of potential side effects including tendon weakness)            PRECAUTIONS: None  WEIGHT BEARING RESTRICTIONS: No  PAIN: Wrist feels pretty good right now. Are you having pain? Yes: NPRS scale: 3 Pain location: R Wrists  Pain description: nagging Aggravating factors: using hands/wrists Relieving factors: massaging wrists, warming them up, voltaren  FALLS: Has patient fallen in last 6 months? No  LIVING ENVIRONMENT: Pt and wife (who has Alzheimer's) recently bought a new home and moved in with his daughter and family Lives with: lives with their family and lives with their daughter, granddaughter 28 yo Lives in: House/apartment Stairs: Yes: External: 3 steps; on right going up in the garage and bilateral but cannot reach both at the front - tends to use the garage Has following equipment at home: shower chair, Grab bars, and walk in shower, raised toilet seat  PLOF: Independent Married 57 years, wife has Alzheimer and he was caring for her until they recently moved in with their daughter and family.  Pt has 2 daughters.  Pt has not been driving lately.  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  HAND DOMINANCE: Left - ambidextrous  ADLs: Overall ADLs: Mod Ind Transfers/ambulation related to ADLs: Ind Eating: Ind Grooming: Ind UB Dressing: Ind LB Dressing: Ind Toileting: Using elevated seat Bathing: Mod Ind Tub Shower transfers: Mod Ind Equipment: Grab bars, Walk in shower, and built in seat as well as raised toilet seat  IADLs: Shopping: Goes on field trips with his daughter Light housekeeping: TBD Meal Prep: daughter - will reheat food - microwave/stove Community mobility: Ind Medication management: Ind and wife's meds and weekly pill box Financial management: Ind Handwriting: TBA  MOBILITY STATUS: Independent  POSTURE COMMENTS:  rounded shoulders and forward head Sitting balance: WFL  ACTIVITY TOLERANCE: Activity tolerance: Good  FUNCTIONAL OUTCOME MEASURES: TBD  UPPER EXTREMITY ROM:   BUE - WFL  UPPER EXTREMITY MMT:     BUE - 4+/5  HAND FUNCTION: Grip strength: Right: 49.8, 55, 56.2  lbs; Left: 60.4, 56.8, 59.0, 64.1 lbs  Average: Right 53.7 lbs Left 60.1 lbs Crunchy noises with BUEs with squeezing  COORDINATION: 9 Hole Peg test: Right: 23.75 sec; Left: 23.75 sec  SENSATION: WFL  EDEMA: wrists swell sometimes  COGNITION: Overall cognitive status: Within functional limits for tasks assessed  OBSERVATIONS: Pt ambulates without AE and no loss of balance. The pt is well kept and was accompanied by his daughter who reports some improvement in his pain since moving in with them as he does not have to do as much to help with taking care of the home or his spouse. Pt and daughter were attentive with daughter reiterating education back to OT/father throughout session. Pt had audible crunching in hands when conducting grip strength testing today also.                                                                                                                             TODAY'S TREATMENT:    Orthotic Management:  Options for splints and positioning trialled and pt was provided a small Oval 8 splint for L pinkie trigger finger and/or encouraged to use a bandaid around the PIP joint to prevent flexion at night.  RUE positioning trial suggested with an elastic stockinette and folder washcloth along the volar aspect of his wrist with good limitations in wrist flexion achieved.  Also trialled a wrist cock up splint to R wrist during UE ROM activities including tendon glide activities with good comfort noted with circumferential fit of splint.  Pt encouraged to consider options including ability to remove metal stay if necessary but shown how the splint reminds him to keep his wrist neutral ie) when trying to use his bow and arrow.  May need to consider thumb spica splint for [facility] support and may need to see his bow to ensure max joint protection with use or modifications to protect joints.  Self Care: OT educated pt on joint protection principles as noted in pt instructions as needed to improve UE pain.   Handout is provided regarding joint protection and patient is encouraged to consider the specific acronym LESS ie) less strain on joints  L: Listen to your body E: Energy Conservation S: Stronger Joints take the Lead S: Strategize   Patient encouraged to protect hands and wrist by  - Respecting for Pain and stopping activities before they reach the point of discomfort or pain  - Rest and Work Balance ie) balancing activities with appropriate rests during activity - Reduction of Effort - Use two hands instead of one if possible  - Use of Larger/Stronger Joints Ie) Lift or carry with the forearm or shoulder rather than fingers - Avoid Activities That Cannot Be Stopped - Use of Assistive Equipment - Consider splint use and AE equipment to protect joints from deformity and stresses Then reviewed activities that can be modified with  adaptive equipment and encourage patient to look adaptive equipment for arthritis [i.e. to consider joint protection] ie) jar opener.  OT educated patient on sleep positioning as he reports laying on his shoulders and is recommended to put a pillow behind his back to get off the shoulder and reduce stress to upper extremity joints, which could be attributing to reported pain in UEs. Patient verbalized understanding. Will need to review and determine need for handout at future visit.  PATIENT EDUCATION: Education details: Joint protection, sleep positions and splint options Person educated: Patient Education method: Explanation, Demonstration, Tactile cues, Verbal cues, and Handouts Education comprehension: verbalized understanding, returned demonstration, verbal cues required, tactile cues required, and needs further education  HOME EXERCISE PROGRAM: 09/16/23: Tendon Gliding Exercises 10/02/23: Joint Protection   GOALS: Goals reviewed with patient? Yes - will review specific goals at next appt    SHORT TERM GOALS: Target date: 10/20/23   Patient will demonstrate initial BUE HEP with 25% verbal cues or less for proper execution. Baseline: New to outpt OT Goal status: IN Progress - Tendon Gliding Exc issued at eval.   2.  Pt to trial prefab hand splints to help improve pain and improve overall comfort for functional use of RUE. Recommend prefab vs fab due to chronicity of issue, adjustability, comfort, and ease of cleaning. Baseline: No splints Goal status: IN Progress   3.  Pt will independently recall at least 3 joint protection, ergonomics, and body mechanic principles as noted in pt instructions to assist with daily tasks with increased comfort and confidence.  Baseline:  New to outpt OT Goal status: IN Progress - Introduced at eval & handouts at visit #2   4.  Patient will demonstrate no loss in grip strength and increased confidence in his grip as needed to open jars and other  containers. Baseline: Right 53.7 lbs Left 60.1 lbs  Goal status: INITIAL   5.  Pt will independently recall sleep positioning options as noted in pt instructions to minimize sleep disturbances to minimal.  Baseline:  New to outpt OT Goal status: IN Progress     LONG TERM GOALS: Target date: 11/20/23   Patient will demonstrate updated BUE HEP with visual instruction only for proper execution. Baseline: New to outpt OT Goal status: IN Progress - Tendon Gliding Exc issued at eval.   2.  Pt will independently recall at least 3 options for adaptive equipment to assist with joint protection, ergonomics, and body mechanic principles as noted in pt instructions for ADLs and IADLS.  Baseline:  New to outpt OT Goal status: IN Progress   3.  Pt will be independent with home based pain management routine to potentially include gloves/splints, heat and joint protection principles for minimal pain <3/10 Baseline: 5/10 Goal status: INITIAL   4.  Patient will demonstrate at least 16% improvement with quick Dash score (reporting TBD% disability or less) indicating improved functional use of affected extremity.  Baseline: QuickDash TBD Goal status: INITIAL   ASSESSMENT:  CLINICAL IMPRESSION: Patient is a 78 y.o. male who was seen today for occupational therapy treatment for B wrist pain, determined to be OA changes of bilateral wrists L>R and bilateral CMC OA changes as well. Patient introduced to splint ideas and nighttime positioning options. Pt would benefit from further skilled OT services in the outpatient setting to work on impairments as noted at eval to help pt return to [facility] as able.    PERFORMANCE DEFICITS: in functional skills including ADLs, IADLs, coordination, dexterity, edema, ROM, strength, pain, flexibility, Fine motor control, Gross motor control, body mechanics, endurance, decreased knowledge of precautions, decreased knowledge  of use of DME, and UE functional use, cognitive skills  including problem solving, and psychosocial skills including coping strategies, environmental adaptation, habits, and routines and behaviors.   IMPAIRMENTS: are limiting patient from ADLs, IADLs, rest and sleep, leisure, and social participation.   CO-MORBIDITIES: may have co-morbidities  that affects occupational performance. Patient will benefit from skilled OT to address above impairments and improve overall function.  REHAB POTENTIAL: Good  PLAN:  OT FREQUENCY: 1x/week - per pt/family request  OT DURATION: 8 weeks  PLANNED INTERVENTIONS: 97535 self care/ADL training, 16109 therapeutic exercise, 97530 therapeutic activity, 97112 neuromuscular re-education, 97140 manual therapy, 97035 ultrasound, 97018 paraffin, 60454 fluidotherapy, 97010 moist heat, 97760 Orthotic Initial, 97763 Orthotic/Prosthetic subsequent, energy conservation, coping strategies training, patient/family education, and DME and/or AE instructions  RECOMMENDED OTHER SERVICES: NA  CONSULTED AND AGREED WITH PLAN OF CARE: Patient and family member/caregiver  PLAN FOR NEXT SESSION:   Review tendon glides Provide soft putty as appropriate Review Joint protection education/handouts Explore further Splint options Modalities for pain management Redo Quick Freddi Jaeger, OT 10/02/2023, 4:37 PM

## 2023-10-08 ENCOUNTER — Ambulatory Visit: Payer: Self-pay | Admitting: Occupational Therapy

## 2023-10-08 DIAGNOSIS — R278 Other lack of coordination: Secondary | ICD-10-CM

## 2023-10-08 DIAGNOSIS — M25531 Pain in right wrist: Secondary | ICD-10-CM

## 2023-10-08 DIAGNOSIS — M25532 Pain in left wrist: Secondary | ICD-10-CM

## 2023-10-08 DIAGNOSIS — M6281 Muscle weakness (generalized): Secondary | ICD-10-CM

## 2023-10-08 DIAGNOSIS — R29898 Other symptoms and signs involving the musculoskeletal system: Secondary | ICD-10-CM

## 2023-10-08 NOTE — Therapy (Signed)
 OUTPATIENT OCCUPATIONAL THERAPY NEURO TREATMENT  Patient Name: Adam Daniel MRN: [id number] DOB:October 03, 1945, 78 y.o., male Today's Date: 10/08/2023  PCP: Benedetta Bradley, MD REFERRING PROVIDER: Rance Burrows, MD  END OF SESSION:  OT End of Session - 10/08/23 0848     Visit Number 3    Number of Visits 9    Date for OT Re-Evaluation 11/11/23    Authorization Type [employer] Medicare 2025 VL: [place] Auth Rqd: [employer]    Authorization Time Period 09/16/23 - 11/11/23    Authorization - Number of Visits 6    OT Start Time 0847    OT Stop Time 0930    OT Time Calculation (min) 43 min    Equipment Utilized During Treatment R wrist splint    Activity Tolerance Patient tolerated treatment well    Behavior During Therapy WFL for tasks assessed/performed          Past Medical History:  Diagnosis Date   Arthritis    right knee    Bradycardia    Cancer (HCC)    melanoma on right shoulder , skin cancer    Chronic kidney disease    stage III   Complication of anesthesia    memory problems   Hypertension    Plaque psoriasis    Pneumonia    hx of years ago    Psoriasis    Sleep apnea    cpap - heated machine 9 setting    Past Surgical History:  Procedure Laterality Date   COLONOSCOPY     INCISIONAL HERNIA REPAIR N/A 06/12/2017   Procedure: OPEN VENTRAL INCISIONAL HERNIA REPAIR WITH MESH;  Surgeon: Oralee Billow, MD;  Location: [facility];  Service: General;  Laterality: N/A;   INSERTION OF MESH N/A 06/12/2017   Procedure: INSERTION OF MESH;  Surgeon: Oralee Billow, MD;  Location: [facility];  Service: General;  Laterality: N/A;   JOINT REPLACEMENT     right total knee   LAPAROSCOPIC PARTIAL COLECTOMY N/A 03/28/2016   Procedure: LAPAROSCOPIC ASSISTED  EXTENDED RIGHT COLECTOMY;  Surgeon: Adalberto Hollow, MD;  Location: [facility];  Service: General;  Laterality: N/A;   melanoma removed from right shoulder      TOTAL KNEE ARTHROPLASTY Right 01/27/2017   Procedure: RIGHT TOTAL KNEE  ARTHROPLASTY;  Surgeon: Liliane Rei, MD;  Location: [facility];  Service: Orthopedics;  Laterality: Right;   Patient Active Problem List   Diagnosis Date Noted   Primary hypertension 08/27/2023   Precordial pain 07/07/2023   Paroxysmal atrial fibrillation (HCC) 07/07/2023   OSA (obstructive sleep apnea) 07/07/2023   Mixed hyperlipidemia 07/07/2023   Incisional hernia 06/08/2017   OA (osteoarthritis) of knee 01/27/2017   Dysplastic polyp of colon 03/28/2016    ONSET DATE: 08/22/2023  REFERRING DIAG:  W09.811 (ICD-10-CM) - Pain in left wrist  M25.531 (ICD-10-CM) - Pain in right wrist  THERAPY DIAG:  Pain in right wrist  Other lack of coordination  Muscle weakness (generalized)  Pain in left wrist  Other symptoms and signs involving the musculoskeletal system  Rationale for Evaluation and Treatment: Rehabilitation  SUBJECTIVE:   SUBJECTIVE STATEMENT: Patient goes by Adam Daniel  Pt reported that his right wrist was feeling better with the brace - has been wearing it all day and through the night.  Wearing his c-pap (titration process) and hand splint  have helped with sleeping.   Pt accompanied by: self   PERTINENT HISTORY:  PMHx: Hypertension, Cervical disc disease, Depression, psoriasis, Melanoma r shoulder 2012, DJD right knee  bone-on-bone, stage III renal disease 06/2018, Sleep apnea, 2 cm adenocarcinoma colon resected 03/2016 repeat colon 04/2017 done Dr Kimble Pennant repeat done 05/2020 small adenoma, 10/2016 1mm cyst tendon left 1st finger, retinal flashing and laser treatment Dr [facility]  Musculoskeletal: Positive for bilateral wrist pain, worse in right wrist. Pain localized to back of wrist. Positive for crunching sensation in wrist. Positive for difficulty opening pinky finger (possible trigger finger)  Severe thumb carpometacarpal, triscaphe, hamate-triquetrum,capitate-lunate and radial-scaphoid osteoarthritis. Probable remote triquetral avulsion  fracture.  X-rays reveal osteoarthritis in both wrists, with the left wrist showing more severe changes including joint space narrowing and extra bone growth in the ulna. The right wrist demonstrates some irregularity and decreased joint space. Pain is localized to the back of the wrist, with no radiation to the fingers.  Plan:- Refer to hand physical therapy for wrist strengthening exercises- Follow-up appointment in 10-12 weeks- Consider corticosteroid injections if pain becomes severe (informed patient of potential side effects including tendon weakness)            PRECAUTIONS: None  WEIGHT BEARING RESTRICTIONS: No  PAIN:  Are you having pain? No  FALLS: Has patient fallen in last 6 months? No  LIVING ENVIRONMENT: Pt and wife (who has Alzheimer's) recently bought a new home and moved in with his daughter and family Lives with: lives with their family and lives with their daughter, granddaughter 79 yo Lives in: House/apartment Stairs: Yes: External: 3 steps; on right going up in the garage and bilateral but cannot reach both at the front - tends to use the garage Has following equipment at home: shower chair, Grab bars, and walk in shower, raised toilet seat  PLOF: Independent Married 57 years, wife has Alzheimer and he was caring for her until they recently moved in with their daughter and family.  Pt has 2 daughters.  Pt has not been driving lately.  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  HAND DOMINANCE: Left - ambidextrous  ADLs: Overall ADLs: Mod Ind Transfers/ambulation related to ADLs: Ind Eating: Ind Grooming: Ind UB Dressing: Ind LB Dressing: Ind Toileting: Using elevated seat Bathing: Mod Ind Tub Shower transfers: Mod Ind Equipment: Grab bars, Walk in shower, and built in seat as well as  raised toilet seat  IADLs: Shopping: Goes on field trips with his daughter Light housekeeping: TBD Meal Prep: daughter - will reheat food - microwave/stove Community mobility: Ind Medication management: Ind and wife's meds and weekly pill box Financial management: Ind Handwriting: TBA  MOBILITY STATUS: Independent  POSTURE COMMENTS:  rounded shoulders and forward head Sitting balance: WFL  ACTIVITY TOLERANCE: Activity tolerance: Good  FUNCTIONAL OUTCOME MEASURES: TBD  UPPER EXTREMITY ROM:   BUE - WFL  UPPER EXTREMITY MMT:     BUE - 4+/5  HAND FUNCTION: Grip strength: Right: 49.8, 55, 56.2  lbs; Left: 60.4, 56.8, 59.0, 64.1 lbs Average: Right 53.7 lbs Left 60.1 lbs Crunchy noises with BUEs with squeezing  COORDINATION: 9 Hole Peg test: Right: 23.75 sec; Left: 23.75 sec  SENSATION: WFL  EDEMA: wrists swell sometimes  COGNITION: Overall cognitive status: Within functional limits for tasks assessed  OBSERVATIONS: Pt ambulates without AE and no loss of balance. The pt is well kept and was accompanied by his daughter who reports some improvement in his pain since moving in with them  as he does not have to do as much to help with taking care of the home or his spouse. Pt and daughter were attentive with daughter reiterating education back to OT/father throughout session. Pt had audible crunching in hands when conducting grip strength testing today also.                                                                                                                            TODAY'S TREATMENT:    Therapeutic Activities:  Coordination Exercise/Activity handout with images provided for various activities to work on B UE finger ROM, dexterity and isolated movements with demonstration and practice, as well as modification, hand over hand guidance and cues throughout to improve technique, digital isolation and ease of performing task.  Tasks included:  Pick up coins,  checkers, dice, puzzle pieces and other objects of different sizes ... To place in containers To stack - with guidance to work on include/isolate specific fingers. To pick up items one at a time until patient got 5+ in their hand and then move item from palm to fingertips to release ie) Finger-to-palm then palm-to-finger translation of small items - Options to vary difficulty include using a washcloth under items like coins or using larger items (checkers vs coins or blocks/dominos vs dice) for increased ease of picking up items.  Utilized modified version of Gillermina Lacer to work on picking up individual [employer] (dice) 1 at a time until pt had 5 in palm to then roll.  Pt had to pick up dice again to try and get up to 5 of a kind images over 3 rolls.   Shuffling, Flipping and dealing cards 1 at a time. -- Setup patient to work on sorting cards, focusing on using index finger with thumb to flip cards or holding deck of cards in palm of hand and push off 1 card at a time from the top of the deck using only thumb  OT educated pt on table top play of Golf Solitaire for BUE to address fine motor coordination and pain reduction. Pt required minimal cues for proper play.     Rotate golf balls (clockwise and counter-clockwise) with forearm pronated and balls on table or supinated and balls in hand.   Twirl pen/cil between fingers. - Encouragement to isolate fingers individually and twirl (rotation) or flipping and shift up and down the pen (translation) to get it in position for writing or erasing.    Tear a piece of paper towel and roll it into small balls with fingertips ie) straw wrapping when eating out.    Patient is encouraged to take breaks, relax arm/shoulder by supporting forearm, minimize compensatory motions and a try different activities throughout the day/week including games like Gillermina Lacer (for the dice), card games, Connect 4, puzzles etc.   Patient benefited from extra time,  verbal/tactile cues, and modeling of task to allow time for processing of verbal instructions and improve motor planning of unfamiliar  movements.    PATIENT EDUCATION: Education details: Psychiatrist Person educated: Patient Education method: Explanation, Demonstration, Tactile cues, Verbal cues, and Handouts Education comprehension: verbalized understanding, returned demonstration, verbal cues required, tactile cues required, and needs further education  HOME EXERCISE PROGRAM: 09/16/23: Tendon Gliding Exercises 10/02/23: Joint Protection 10/08/23: Coordination Activities   GOALS: Goals reviewed with patient? Yes - will review specific goals at next appt    SHORT TERM GOALS: Target date: 10/20/23   Patient will demonstrate initial BUE HEP with 25% verbal cues or less for proper execution. Baseline: New to outpt OT Goal status: IN Progress - Tendon Gliding Exc issued at eval.   2.  Pt to trial prefab hand splints to help improve pain and improve overall comfort for functional use of RUE. Recommend prefab vs fab due to chronicity of issue, adjustability, comfort, and ease of cleaning. Baseline: No splints Goal status: IN Progress   3.  Pt will independently recall at least 3 joint protection, ergonomics, and body mechanic principles as noted in pt instructions to assist with daily tasks with increased comfort and confidence.  Baseline:  New to outpt OT Goal status: IN Progress - Introduced at eval & handouts at visit #2   4.  Patient will demonstrate no loss in grip strength and increased confidence in his grip as needed to open jars and other containers. Baseline: Right 53.7 lbs Left 60.1 lbs  Goal status: INITIAL   5.  Pt will independently recall sleep positioning options as noted in pt instructions to minimize sleep disturbances to minimal.  Baseline:  New to outpt OT Goal status: IN Progress     LONG TERM GOALS: Target date: 11/20/23   Patient will demonstrate  updated BUE HEP with visual instruction only for proper execution. Baseline: New to outpt OT Goal status: IN Progress - Tendon Gliding Exc issued at eval.   2.  Pt will independently recall at least 3 options for adaptive equipment to assist with joint protection, ergonomics, and body mechanic principles as noted in pt instructions for ADLs and IADLS.  Baseline:  New to outpt OT Goal status: IN Progress   3.  Pt will be independent with home based pain management routine to potentially include gloves/splints, heat and joint protection principles for minimal pain <3/10 Baseline: 5/10 Goal status: IN Progress   4.  Patient will demonstrate at least 16% improvement with quick Dash score (reporting TBD% disability or less) indicating improved functional use of affected extremity.  Baseline: QuickDash TBD Goal status: INITIAL   ASSESSMENT:  CLINICAL IMPRESSION: Patient is a 78 y.o. male who was seen today for occupational therapy treatment for B wrist pain, determined to be OA changes of bilateral wrists and bilateral CMC OA changes as well. Patient reported splint option was helpful and he got a pre fab one he is wearing often day and night with decreased pain to 0/10 today.  Introduced FM activities to improve in hand manipulation and motor coordination with attention to pain and jt position. Pt would benefit from further skilled OT services in the outpatient setting to work on impairments as noted at eval to help pt return to [facility] as able.    PERFORMANCE DEFICITS: in functional skills including ADLs, IADLs, coordination, dexterity, edema, ROM, strength, pain, flexibility, Fine motor control, Gross motor control, body mechanics, endurance, decreased knowledge of precautions, decreased knowledge of use of DME, and UE functional use, cognitive skills including problem solving, and psychosocial skills including coping strategies, environmental adaptation, habits,  and routines and behaviors.    IMPAIRMENTS: are limiting patient from ADLs, IADLs, rest and sleep, leisure, and social participation.   CO-MORBIDITIES: may have co-morbidities  that affects occupational performance. Patient will benefit from skilled OT to address above impairments and improve overall function.  REHAB POTENTIAL: Good  PLAN:  OT FREQUENCY: 1x/week - per pt/family request  OT DURATION: 8 weeks  PLANNED INTERVENTIONS: 97535 self care/ADL training, 09811 therapeutic exercise, 97530 therapeutic activity, 97112 neuromuscular re-education, 97140 manual therapy, 97035 ultrasound, 97018 paraffin, 91478 fluidotherapy, 97010 moist heat, 97760 Orthotic Initial, 97763 Orthotic/Prosthetic subsequent, energy conservation, coping strategies training, patient/family education, and DME and/or AE instructions  RECOMMENDED OTHER SERVICES: NA  CONSULTED AND AGREED WITH PLAN OF CARE: Patient and family member/caregiver  PLAN FOR NEXT SESSION:   Provide soft putty as appropriate and wrist ROM Review Joint protection education/handouts Check Splint options Modalities for pain management Redo Quick Freddi Jaeger, OT 10/08/2023, 9:55 AM

## 2023-10-13 ENCOUNTER — Ambulatory Visit: Payer: Self-pay | Admitting: Occupational Therapy

## 2023-10-13 DIAGNOSIS — M6281 Muscle weakness (generalized): Secondary | ICD-10-CM

## 2023-10-13 DIAGNOSIS — M25532 Pain in left wrist: Secondary | ICD-10-CM

## 2023-10-13 DIAGNOSIS — M25531 Pain in right wrist: Secondary | ICD-10-CM

## 2023-10-13 DIAGNOSIS — R278 Other lack of coordination: Secondary | ICD-10-CM

## 2023-10-13 NOTE — Therapy (Signed)
 OUTPATIENT OCCUPATIONAL THERAPY NEURO TREATMENT  Patient Name: MELITON SAMAD MRN: [id number] DOB:1946/01/30, 78 y.o., male 54 Date: 10/13/2023  PCP: Charlott Dorn DELENA, MD REFERRING PROVIDER: Dasie Fitch, MD  END OF SESSION:  OT End of Session - 10/13/23 0850     Visit Number 4    Number of Visits 9    Date for OT Re-Evaluation 11/11/23    Authorization Type [employer] Medicare 2025 VL: [place] Auth Rqd: [employer]    Authorization Time Period 6 OT visits 5/27-7/22/25    Authorization - Visit Number 4    Authorization - Number of Visits 6    OT Start Time 0850    OT Stop Time 0933    OT Time Calculation (min) 43 min    Equipment Utilized During Treatment R wrist splint, pill box/containers    Activity Tolerance Patient tolerated treatment well    Behavior During Therapy WFL for tasks assessed/performed          Past Medical History:  Diagnosis Date   Arthritis    right knee    Bradycardia    Cancer (HCC)    melanoma on right shoulder , skin cancer    Chronic kidney disease    stage III   Complication of anesthesia    memory problems   Hypertension    Plaque psoriasis    Pneumonia    hx of years ago    Psoriasis    Sleep apnea    cpap - heated machine 9 setting    Past Surgical History:  Procedure Laterality Date   COLONOSCOPY     INCISIONAL HERNIA REPAIR N/A 06/12/2017   Procedure: OPEN VENTRAL INCISIONAL HERNIA REPAIR WITH MESH;  Surgeon: Eletha Boas, MD;  Location: [facility];  Service: General;  Laterality: N/A;   INSERTION OF MESH N/A 06/12/2017   Procedure: INSERTION OF MESH;  Surgeon: Eletha Boas, MD;  Location: [facility];  Service: General;  Laterality: N/A;   JOINT REPLACEMENT     right total knee   LAPAROSCOPIC PARTIAL COLECTOMY N/A 03/28/2016   Procedure: LAPAROSCOPIC ASSISTED  EXTENDED RIGHT COLECTOMY;  Surgeon: Boas Russell, MD;  Location: [facility];  Service: General;  Laterality: N/A;   melanoma removed from right shoulder      TOTAL KNEE  ARTHROPLASTY Right 01/27/2017   Procedure: RIGHT TOTAL KNEE ARTHROPLASTY;  Surgeon: Melodi Lerner, MD;  Location: [facility];  Service: Orthopedics;  Laterality: Right;   Patient Active Problem List   Diagnosis Date Noted   Primary hypertension 08/27/2023   Precordial pain 07/07/2023   Paroxysmal atrial fibrillation (HCC) 07/07/2023   OSA (obstructive sleep apnea) 07/07/2023   Mixed hyperlipidemia 07/07/2023   Incisional hernia 06/08/2017   OA (osteoarthritis) of knee 01/27/2017   Dysplastic polyp of colon 03/28/2016    ONSET DATE: 08/22/2023  REFERRING DIAG:  F74.467 (ICD-10-CM) - Pain in left wrist  M25.531 (ICD-10-CM) - Pain in right wrist  THERAPY DIAG:  Pain in right wrist  Muscle weakness (generalized)  Other lack of coordination  Pain in left wrist  Rationale for Evaluation and Treatment: Rehabilitation  SUBJECTIVE:   SUBJECTIVE STATEMENT: Patient goes by [name]  Pt was accompanied by his daughter today.  He reported to her that he was dreaming last night and fell out off of the bed and hit his forehead face on the carpeted floor. He was wearing his Cpap and rubbed a bit of skin/ 'rug burn' from the strap or carpet on his forehead.  Pt reports this has  happened in the past but it has been many years.  He had a rail on his other bed but it has not been brought to their new home and they are not sure it will fit on the adjustable bed they have now but they were going to check it out.    Pt reports his right hand feels good especially with brace.  His daughter does not that he does not wear the splint much in the daytime but does often put it on in the evening.  His pain has been under 3/10 at it's worst this past week.  He had his splint on this morning due to falling last night and also hitting his hand on the floor.  He took a couple of Tylenols though and was able to go back to sleep.     Pt accompanied by: self   PERTINENT HISTORY:  PMHx: Hypertension, Cervical disc  disease, Depression, psoriasis, Melanoma r shoulder 2012, DJD right knee bone-on-bone, stage III renal disease 06/2018, Sleep apnea, 2 cm adenocarcinoma colon resected 03/2016 repeat colon 04/2017 done Dr Burnette repeat done 05/2020 small adenoma, 10/2016 1mm cyst tendon left 1st finger, retinal flashing and laser treatment Dr [facility]  Musculoskeletal: Positive for bilateral wrist pain, worse in right wrist. Pain localized to back of wrist. Positive for crunching sensation in wrist. Positive for difficulty opening pinky finger (possible trigger finger)  Severe thumb carpometacarpal, triscaphe, hamate-triquetrum,capitate-lunate and radial-scaphoid osteoarthritis. Probable remote triquetral avulsion fracture.  X-rays reveal osteoarthritis in both wrists, with the left wrist showing more severe changes including joint space narrowing and extra bone growth in the ulna. The right wrist demonstrates some irregularity and decreased joint space. Pain is localized to the back of the wrist, with no radiation to the fingers.  Plan:- Refer to hand physical therapy for wrist strengthening exercises- Follow-up appointment in 10-12 weeks- Consider corticosteroid injections if pain becomes severe (informed patient of potential side effects including tendon weakness)            PRECAUTIONS: None  WEIGHT BEARING RESTRICTIONS: No  PAIN:  Are you having pain? No Pain in wrist at worst ~ 3/10  FALLS: Has patient fallen in last 6 months? Yes. Number of falls 6/23 - rolled out of bed during a dream and landed on his hands and forehead on the floor   LIVING ENVIRONMENT: Pt and wife (who has Alzheimer's) recently bought a new home and moved in with his daughter and family Lives with: lives with their family and lives with their daughter, granddaughter 55 yo Lives in: House/apartment Stairs: Yes: External: 3 steps; on right going up in the garage and bilateral but cannot reach both at the front - tends to use  the garage Has following equipment at home: shower chair, Grab bars, and walk in shower, raised toilet seat  PLOF: Independent Married 57 years, wife has Alzheimer and he was caring for her until they recently moved in with their daughter and family.  Pt has 2 daughters.  Pt has not been driving lately.  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  HAND DOMINANCE: Left - ambidextrous  ADLs: Overall ADLs: Mod Ind Transfers/ambulation related to ADLs: Ind Eating: Ind Grooming: Ind UB Dressing: Ind LB Dressing: Ind Toileting: Using elevated seat Bathing: Mod Ind Tub Shower transfers: Mod Ind Equipment:  Grab bars, Walk in shower, and built in seat as well as raised toilet seat  IADLs: Shopping: Goes on field trips with his daughter Light housekeeping: TBD Meal Prep: daughter - will reheat food - microwave/stove Community mobility: Ind Medication management: Ind and wife's meds and weekly pill box Financial management: Ind Handwriting: TBA  MOBILITY STATUS: Independent  POSTURE COMMENTS:  rounded shoulders and forward head Sitting balance: WFL  ACTIVITY TOLERANCE: Activity tolerance: Good  FUNCTIONAL OUTCOME MEASURES: TBD  UPPER EXTREMITY ROM:   BUE - WFL  UPPER EXTREMITY MMT:     BUE - 4+/5  HAND FUNCTION: Grip strength: Right: 49.8, 55, 56.2  lbs; Left: 60.4, 56.8, 59.0, 64.1 lbs Average: Right 53.7 lbs Left 60.1 lbs Crunchy noises with BUEs with squeezing  COORDINATION: 9 Hole Peg test: Right: 23.75 sec; Left: 23.75 sec  SENSATION: WFL  EDEMA: wrists swell sometimes  COGNITION: Overall cognitive status: Within functional limits for tasks assessed  OBSERVATIONS: Pt ambulates without AE and no loss of balance. The pt is well kept and was accompanied by his daughter who reports  some improvement in his pain since moving in with them as he does not have to do as much to help with taking care of the home or his spouse. Pt and daughter were attentive with daughter reiterating education back to OT/father throughout session. Pt had audible crunching in hands when conducting grip strength testing today also.                                                                                                                            TODAY'S TREATMENT:    Self care:  Reviewed sleep positions with UE support in supine with possibly considering his bed rails and/or a pool noodle under the fitted sheet on the edge of bed for safety as well as small pillow or roll under his elbows to support his arms during sleep.  Reviewed goals and modifications/equipment options at home with pt/daughter able to report multiple modifications since OT evaluation and education initiated: -Pt obtained and is using the battery operated jar opener often -Pt obtained and is using wrist splint at times as well as copper gloves -Pt is  more mindful of things ie) daughter reports he is adjusting his grasp of objects to protect his hands/wrists ie) hugging laundry basket etc -Pt's daughter found a modified cup with a center hole to increase ease with picking up his drinks  Continued to explore areas of difficulty with info provided re: spring loaded scissors to protect thumbs when using scissors to cut open [employer] pkgs etc.  Pt engaged in practicing to sort medication as daughter reports he has been dropping more pills lately.  Pt encouraged to put a towel/washcloth down to put the pills on to avoid them rolling away. During practice with beads/small pegs he was able to successfully fill the pillbox without dropping items when picking them up form the washcloth.  He prefers to  keep the pills in the bottle though and was noted to drop items a couple of times when pouring them into his hands.  With the washcloth on  the tabletop, none of the dropped items left the table or rolled to the floor.  Therapeutic Activities:  Reviewed coordination activities for various activities to work on B UE finger ROM, dexterity and isolated movements with guidance to work with and without splints as tolerated, work on [employer] tasks and games as functional activities with grandchildren.  Activities to work on B UE finger ROM, dexterity and isolated movements included picking up beads and pegs of petite sizes to simulate medication management.  Pt encouraged to pick up items one at a time until patient got 2-5+ in his hand and then move item from palm to fingertips to release ie) finger-to-palm then palm-to-finger translation of small items  PATIENT EDUCATION: Education details: AE and joint protection Person educated: Patient Education method: Explanation, Demonstration, and Verbal cues Education comprehension: verbalized understanding, returned demonstration, verbal cues required, and needs further education  HOME EXERCISE PROGRAM: 09/16/23: Tendon Gliding Exercises 10/02/23: Joint Protection 10/08/23: Coordination Activities  GOALS: Goals reviewed with patient? Yes - will review specific goals at next appt    SHORT TERM GOALS: Target date: 10/20/23   Patient will demonstrate initial BUE HEP with 25% verbal cues or less for proper execution. Baseline: New to outpt OT Goal status: In Progress - Tendon Gliding Exc issued at eval. 10/08/23: + Coordination Activities    2.  Pt to trial prefab hand splints to help improve pain and improve overall comfort for functional use of RUE. Recommend prefab vs fab due to chronicity of issue, adjustability, comfort, and ease of cleaning. Baseline: No splints Goal status: MET - R wrist cock up splint   3.  Pt will independently recall at least 3 joint protection, ergonomics, and body mechanic principles as noted in pt instructions to assist with daily tasks with increased comfort and  confidence.  Baseline:  New to outpt OT Goal status: MET - Introduced at eval & handouts at visit #2 10/13/23 - Reports: splints, AE and modified grasp   4.  Patient will demonstrate no loss in grip strength and increased confidence in his grip as needed to open jars and other containers. Baseline: Right 53.7 lbs Left 60.1 lbs  Goal status: IN Progress   5.  Pt will independently recall sleep positioning options as noted in pt instructions to minimize sleep disturbances to minimal.  Baseline:  New to outpt OT Goal status: IN Progress    LONG TERM GOALS: Target date: 11/20/23   Patient will demonstrate updated BUE HEP with visual instruction only for proper execution. Baseline: New to outpt OT Goal status: IN Progress - Tendon Gliding Exc issued at eval.   2.  Pt will independently recall at least 3 options for adaptive equipment to assist with joint protection, ergonomics, and body mechanic principles as noted in pt instructions for ADLs and IADLS.  Baseline:  New to outpt OT Goal status: MET 10/13/23: Jar opener, pill box, adapted cup and idea re: spring loaded scissors  3.  Pt will be independent with home based pain management routine to potentially include gloves/splints, heat and joint protection principles for minimal pain <3/10 Baseline: 5/10 Goal status: IN Progress   4.  Patient will demonstrate at least 16% improvement with quick Dash score (reporting TBD% disability or less) indicating improved functional use of affected extremity.  Baseline: QuickDash TBD Goal status:  INITIAL   ASSESSMENT:  CLINICAL IMPRESSION: Patient is a 78 y.o. male who was seen today for occupational therapy treatment for B wrist pain, determined to be OA changes of bilateral wrists and bilateral CMC OA changes as well. Patient reporting splint continues to be very helpful and now uses it mostly in the evening and pain generally <3/10.  Reviewed adaptive equipment options and positioning to help  with pain.  Did not provide putty today s/p fall from bed with slight jarring injury of hands - will check at next appt.  Pt would benefit from further skilled OT services in the outpatient setting to work on impairments as noted at eval to help pt return to [facility] as able.    PERFORMANCE DEFICITS: in functional skills including ADLs, IADLs, coordination, dexterity, edema, ROM, strength, pain, flexibility, Fine motor control, Gross motor control, body mechanics, endurance, decreased knowledge of precautions, decreased knowledge of use of DME, and UE functional use, cognitive skills including problem solving, and psychosocial skills including coping strategies, environmental adaptation, habits, and routines and behaviors.   IMPAIRMENTS: are limiting patient from ADLs, IADLs, rest and sleep, leisure, and social participation.   CO-MORBIDITIES: may have co-morbidities  that affects occupational performance. Patient will benefit from skilled OT to address above impairments and improve overall function.  REHAB POTENTIAL: Good  PLAN:  OT FREQUENCY: 1x/week - per pt/family request  OT DURATION: 8 weeks  PLANNED INTERVENTIONS: 97535 self care/ADL training, 02889 therapeutic exercise, 97530 therapeutic activity, 97112 neuromuscular re-education, 97140 manual therapy, 97035 ultrasound, 97018 paraffin, 02960 fluidotherapy, 97010 moist heat, 97760 Orthotic Initial, 97763 Orthotic/Prosthetic subsequent, energy conservation, coping strategies training, patient/family education, and DME and/or AE instructions  RECOMMENDED OTHER SERVICES: NA  CONSULTED AND AGREED WITH PLAN OF CARE: Patient and family member/caregiver  PLAN FOR NEXT SESSION:   Provide soft putty as appropriate and wrist ROM Review Joint protection education/handouts Modalities for pain management Redo Quick Hollis Clarita LITTIE Dorise, OT 10/13/2023, 11:15 AM

## 2023-10-20 ENCOUNTER — Encounter: Payer: Self-pay | Admitting: Occupational Therapy

## 2023-10-28 ENCOUNTER — Ambulatory Visit: Payer: Self-pay | Attending: Sports Medicine | Admitting: Occupational Therapy

## 2023-10-28 DIAGNOSIS — M25531 Pain in right wrist: Secondary | ICD-10-CM | POA: Diagnosis present

## 2023-10-28 DIAGNOSIS — M6281 Muscle weakness (generalized): Secondary | ICD-10-CM | POA: Diagnosis present

## 2023-10-28 DIAGNOSIS — R278 Other lack of coordination: Secondary | ICD-10-CM | POA: Diagnosis present

## 2023-10-28 DIAGNOSIS — M25532 Pain in left wrist: Secondary | ICD-10-CM | POA: Insufficient documentation

## 2023-10-28 DIAGNOSIS — R29898 Other symptoms and signs involving the musculoskeletal system: Secondary | ICD-10-CM | POA: Diagnosis present

## 2023-10-28 NOTE — Patient Instructions (Signed)
 Access Code: [email or web address] URL: [email or web address] Date: 10/28/2023 Prepared by: Clarita Pride  Exercises - Putty Squeezes  - 1-2 x daily - 10 reps - Rolling Putty on Table  - 1-2 x daily - 10 reps - Finger Pinch and Pull with Putty  - 1-2 x daily - 10 reps - 3-Point Pinch with Putty  - 1-2 x daily - 10 reps - Tip PUSH with Putty  - 1-2 x daily - 5-10 reps - Key Pinch with Putty  - 1-2 x daily - 10 reps - Finger Extension with Putty  - 1-2 x daily - 5-10 reps - Finger Adduction with Putty  - 1-2 x daily - 10 reps - Removing Marbles from Putty  - 1-2 x daily - 10 reps

## 2023-10-28 NOTE — Therapy (Unsigned)
 OUTPATIENT OCCUPATIONAL THERAPY NEURO TREATMENT  Patient Name: Adam Daniel MRN: [id number] DOB:06-08-1945, 78 y.o., male Today's Date: 10/28/2023  PCP: Adam Dorn DELENA, MD REFERRING PROVIDER: Dasie Fitch, MD  END OF SESSION:  OT End of Session - 10/28/23 0759     Visit Number 5    Number of Visits 9    Date for OT Re-Evaluation 11/11/23    Authorization Type [employer] Medicare 2025 VL: [place] Auth Rqd: [employer]    Authorization Time Period 6 OT visits 5/27-7/22/25    Authorization - Visit Number 5    Authorization - Number of Visits 6    OT Start Time 0800    OT Stop Time 0845    OT Time Calculation (min) 45 min    Equipment Utilized During Treatment Pink putty    Activity Tolerance Patient tolerated treatment well    Behavior During Therapy WFL for tasks assessed/performed          Past Medical History:  Diagnosis Date   Arthritis    right knee    Bradycardia    Cancer (HCC)    melanoma on right shoulder , skin cancer    Chronic kidney disease    stage III   Complication of anesthesia    memory problems   Hypertension    Plaque psoriasis    Pneumonia    hx of years ago    Psoriasis    Sleep apnea    cpap - heated machine 9 setting    Past Surgical History:  Procedure Laterality Date   COLONOSCOPY     INCISIONAL HERNIA REPAIR N/A 06/12/2017   Procedure: OPEN VENTRAL INCISIONAL HERNIA REPAIR WITH MESH;  Surgeon: Adam Boas, MD;  Location: [facility];  Service: General;  Laterality: N/A;   INSERTION OF MESH N/A 06/12/2017   Procedure: INSERTION OF MESH;  Surgeon: Adam Boas, MD;  Location: [facility];  Service: General;  Laterality: N/A;   JOINT REPLACEMENT     right total knee   LAPAROSCOPIC PARTIAL COLECTOMY N/A 03/28/2016   Procedure: LAPAROSCOPIC ASSISTED  EXTENDED RIGHT COLECTOMY;  Surgeon: Daniel Russell, MD;  Location: [facility];  Service: General;  Laterality: N/A;   melanoma removed from right shoulder      TOTAL KNEE ARTHROPLASTY Right 01/27/2017    Procedure: RIGHT TOTAL KNEE ARTHROPLASTY;  Surgeon: Adam Lerner, MD;  Location: [facility];  Service: Orthopedics;  Laterality: Right;   Patient Active Problem List   Diagnosis Date Noted   Primary hypertension 08/27/2023   Precordial pain 07/07/2023   Paroxysmal atrial fibrillation (HCC) 07/07/2023   OSA (obstructive sleep apnea) 07/07/2023   Mixed hyperlipidemia 07/07/2023   Incisional hernia 06/08/2017   OA (osteoarthritis) of knee 01/27/2017   Dysplastic polyp of colon 03/28/2016    ONSET DATE: 08/22/2023  REFERRING DIAG:  F74.467 (ICD-10-CM) - Pain in left wrist  M25.531 (ICD-10-CM) - Pain in right wrist  THERAPY DIAG:  Muscle weakness (generalized)  Other lack of coordination  Pain in right wrist  Pain in left wrist  Other symptoms and signs involving the musculoskeletal system  Rationale for Evaluation and Treatment: Rehabilitation  SUBJECTIVE:   SUBJECTIVE STATEMENT: Patient goes by Adam Daniel  Pt reports he has woke up with hands clenched a few times and his pain in R wrist/hand at worst was 5/10 on one morinig when he woke up with hands clenched but it dissipated after moving his hand.    Pt accompanied by: self   PERTINENT HISTORY:  PMHx: Hypertension, Cervical  disc disease, Depression, psoriasis, Melanoma r shoulder 2012, DJD right knee bone-on-bone, stage III renal disease 06/2018, Sleep apnea, 2 cm adenocarcinoma colon resected 03/2016 repeat colon 04/2017 done Adam Daniel repeat done 05/2020 small adenoma, 10/2016 1mm cyst tendon left 1st finger, retinal flashing and laser treatment [facility]  Musculoskeletal: Positive for bilateral wrist pain, worse in right wrist. Pain localized to back of wrist. Positive for crunching sensation in wrist. Positive for difficulty opening pinky finger (possible trigger finger)  Severe thumb carpometacarpal, triscaphe, hamate-triquetrum,capitate-lunate and radial-scaphoid osteoarthritis. Probable remote triquetral  avulsion fracture.  X-rays reveal osteoarthritis in both wrists, with the left wrist showing more severe changes including joint space narrowing and extra bone growth in the ulna. The right wrist demonstrates some irregularity and decreased joint space. Pain is localized to the back of the wrist, with no radiation to the fingers.  Plan:- Refer to hand physical therapy for wrist strengthening exercises- Follow-up appointment in 10-12 weeks- Consider corticosteroid injections if pain becomes severe (informed patient of potential side effects including tendon weakness)            PRECAUTIONS: None  WEIGHT BEARING RESTRICTIONS: No  PAIN:  Are you having pain? No upon arrival for therapy today Pain in wrist at worst ~ 5/10 - one morning when he woke up with hands clenched but it dissipates after moving hand   FALLS: Has patient fallen in last 6 months? Yes. Number of falls 6/23 - rolled out of bed during a dream and landed on his hands and forehead on the floor   LIVING ENVIRONMENT: Pt and wife (who has Alzheimer's) recently bought a new home and moved in with his daughter and family Lives with: lives with their family and lives with their daughter, granddaughter 48 yo Lives in: House/apartment Stairs: Yes: External: 3 steps; on right going up in the garage and bilateral but cannot reach both at the front - tends to use the garage Has following equipment at home: shower chair, Grab bars, and walk in shower, raised toilet seat  PLOF: Independent Married 57 years, wife has Alzheimer and he was caring for her until they recently moved in with their daughter and family.  Pt has 2 daughters.  Pt has not been driving lately.  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  HAND DOMINANCE: Left - ambidextrous  ADLs: Overall  ADLs: Mod Ind Transfers/ambulation related to ADLs: Ind Eating: Ind Grooming: Ind UB Dressing: Ind LB Dressing: Ind Toileting: Using elevated seat Bathing: Mod Ind Tub Shower transfers: Mod Ind Equipment: Grab bars, Walk in shower, and built in seat as well as raised toilet seat  IADLs: Shopping: Goes on field trips with his daughter Light housekeeping: TBD Meal Prep: daughter - will reheat food - microwave/stove Community mobility: Ind Medication management: Ind and wife's meds and weekly pill box Financial management: Ind Handwriting: TBA  MOBILITY STATUS: Independent  POSTURE COMMENTS:  rounded shoulders and forward head Sitting balance: WFL  ACTIVITY TOLERANCE: Activity tolerance: Good  FUNCTIONAL OUTCOME MEASURES: TBD  UPPER EXTREMITY ROM:   BUE - WFL  UPPER EXTREMITY MMT:     BUE - 4+/5  HAND FUNCTION: Grip strength: Right: 49.8, 55, 56.2  lbs; Left: 60.4, 56.8, 59.0, 64.1 lbs Average: Right 53.7 lbs Left 60.1 lbs Crunchy noises with BUEs with squeezing  COORDINATION:  9 Hole Peg test: Right: 23.75 sec; Left: 23.75 sec  SENSATION: WFL  EDEMA: wrists swell sometimes  COGNITION: Overall cognitive status: Within functional limits for tasks assessed  OBSERVATIONS: Pt ambulates without AE and no loss of balance. The pt is well kept and was accompanied by his daughter who reports some improvement in his pain since moving in with them as he does not have to do as much to help with taking care of the home or his spouse. Pt and daughter were attentive with daughter reiterating education back to OT/father throughout session. Pt had audible crunching in hands when conducting grip strength testing today also.                                                                                                                            TODAY'S TREATMENT:    Therapeutic Activities:  Initiated Putty Exercises with pink putty to maintain strength and coordination of B  UEs and improve joint positioning.  Patient provided visual demonstration, verbal and tactile cues as needed to improve performance of the various exercises/activities including:   - Putty Squeezes - cues to gently squeeze putty into log for use with other exercises and to fold putty in half with 1 hand or work on rolling putty into cinnamon bun shape to work on in Scientist, physiological  - [employer] - encourage to roll putty into logs with sensory stimulation to entire length of hand, fingers and wrist as needed   - Pinch and Pull with Putty - this motion is combined with different pinches (3-Point Pinch, Tip Pinch, Key Pinch) - patient encouraged to combine tripod, pincer and/or key pinch with pinch and pull motion of putty pulling away from midline, changing between different pinches and changing different directions to change grip.  Education provided on working on rounded pinch with thumb (especially on R hand) to avoid hyperextension of CMC joint   - Finger Extension with Putty - pt shown how to work on task with all fingers and thumb as well as individual fingers in opposition to thumb or finger abduction between digits  - Finger Adduction with Putty - pt shown how to work on weaving putty between fingers/thumb and then squeeze fingers together while laying hand flat on table top.  - Removing Objects from Putty  - encouraged to hide items (coins, marble, dice etc) and use one hand at a time to find the objects using different pinches etc to digs them out   OT educated patient on theraputty recommendations: avoid hot environments, place in designated container, avoid contact with fabrics. Patient verbalized understanding.    Patient benefited from extra time, verbal/tactile cues, and modeling of task to allow time for processing of verbal instructions and improve motor planning of unfamiliar movements.  PATIENT EDUCATION: Education details: Putty Activities Person educated:  Patient Education method: Explanation, Demonstration, Tactile cues, Verbal cues, and Handouts Education comprehension: verbalized understanding, returned demonstration, verbal cues required, and needs  further education  HOME EXERCISE PROGRAM: 09/16/23: Tendon Gliding Exercises 10/02/23: Joint Protection 10/08/23: Coordination Activities 10/28/23: Putty Activities Access Code: [email or web address]  GOALS: Goals reviewed with patient? Yes    SHORT TERM GOALS: Target date: 10/20/23   Patient will demonstrate initial BUE HEP with 25% verbal cues or less for proper execution. Baseline: New to outpt OT Goal status: MET - Tendon Gliding Exc issued at eval.   2.  Pt to trial prefab hand splints to help improve pain and improve overall comfort for functional use of RUE. Recommend prefab vs fab due to chronicity of issue, adjustability, comfort, and ease of cleaning. Baseline: No splints Goal status: MET - R wrist cock up splint   3.  Pt will independently recall at least 3 joint protection, ergonomics, and body mechanic principles as noted in pt instructions to assist with daily tasks with increased comfort and confidence.  Baseline:  New to outpt OT Goal status: MET - Introduced at eval & handouts at visit #2 10/13/23 - Reports: splints, AE and modified grasp   4.  Patient will demonstrate no loss in grip strength and increased confidence in his grip as needed to open jars and other containers. Baseline: Right 53.7 lbs Left 60.1 lbs  Goal status: IN Progress   5.  Pt will independently recall sleep positioning options as noted in pt instructions to minimize sleep disturbances to minimal.  Baseline:  New to outpt OT Goal status: IN Progress 10/28/23: Hands gripped today    LONG TERM GOALS: Target date: 11/20/23   Patient will demonstrate updated BUE HEP with visual instruction only for proper execution. Baseline: New to outpt OT Goal status: IN Progress - Tendon Gliding Exc issued at eval. 10/08/23: +  Coordination Activities  10/28/23: + Putty Activities   2.  Pt will independently recall at least 3 options for adaptive equipment to assist with joint protection, ergonomics, and body mechanic principles as noted in pt instructions for ADLs and IADLS.  Baseline:  New to outpt OT Goal status: MET 10/13/23: Jar opener, pill box, adapted cup and idea re: spring loaded scissors  3.  Pt will be independent with home based pain management routine to potentially include gloves/splints, heat and joint protection principles for minimal pain <3/10 Baseline: 5/10 Goal status: IN Progress   4.  Patient will demonstrate at least 16% improvement with quick Dash score (reporting TBD% disability or less) indicating improved functional use of affected extremity.  Baseline: QuickDash TBD Goal status: IN Progress   ASSESSMENT:  CLINICAL IMPRESSION: Patient is a 78 y.o. male who was seen today for occupational therapy treatment for B wrist pain, determined to be OA changes of bilateral wrists and bilateral CMC OA changes as well. Patient reporting splint as supportive and helpful with pain in R hand/wrist.  Provided light putty activities with education on joint positioning for max comfort and protection of thumbs especially.  Pt will benefit from further skilled OT services in the outpatient setting to work on impairments as noted at eval to help pt return to [facility] as able.    PERFORMANCE DEFICITS: in functional skills including ADLs, IADLs, coordination, dexterity, edema, ROM, strength, pain, flexibility, Fine motor control, Gross motor control, body mechanics, endurance, decreased knowledge of precautions, decreased knowledge of use of DME, and UE functional use, cognitive skills including problem solving, and psychosocial skills including coping strategies, environmental adaptation, habits, and routines and behaviors.   IMPAIRMENTS: are limiting patient from ADLs, IADLs, rest and sleep, leisure, and  social  participation.   CO-MORBIDITIES: may have co-morbidities  that affects occupational performance. Patient will benefit from skilled OT to address above impairments and improve overall function.  REHAB POTENTIAL: Good  PLAN:  OT FREQUENCY: 1x/week - per pt/family request  OT DURATION: 8 weeks  PLANNED INTERVENTIONS: 97535 self care/ADL training, 02889 therapeutic exercise, 97530 therapeutic activity, 97112 neuromuscular re-education, 97140 manual therapy, 97035 ultrasound, 97018 paraffin, 02960 fluidotherapy, 97010 moist heat, 97760 Orthotic Initial, 97763 Orthotic/Prosthetic subsequent, energy conservation, coping strategies training, patient/family education, and DME and/or AE instructions  RECOMMENDED OTHER SERVICES: NA  CONSULTED AND AGREED WITH PLAN OF CARE: Patient and family member/caregiver  PLAN FOR NEXT SESSION:   Review putty and wrist ROM HEP ideas Review/complete Joint protection education/handouts Modalities for pain management Redo Quick Hollis Clarita LITTIE Dorise, OT 10/28/2023, 2:36 PM

## 2023-11-05 ENCOUNTER — Ambulatory Visit: Payer: Self-pay | Admitting: Occupational Therapy

## 2023-11-05 DIAGNOSIS — M6281 Muscle weakness (generalized): Secondary | ICD-10-CM

## 2023-11-05 DIAGNOSIS — R278 Other lack of coordination: Secondary | ICD-10-CM

## 2023-11-05 DIAGNOSIS — M25532 Pain in left wrist: Secondary | ICD-10-CM

## 2023-11-05 DIAGNOSIS — M25531 Pain in right wrist: Secondary | ICD-10-CM

## 2023-11-05 NOTE — Therapy (Signed)
 OUTPATIENT OCCUPATIONAL THERAPY NEURO TREATMENT & DISCHARGE SUMMARY  Patient Name: Adam Daniel MRN: [id number] DOB:December 09, 1945, 78 y.o., male 38 Date: 11/05/2023  PCP: Charlott Dorn DELENA, MD REFERRING PROVIDER: Dasie Fitch, MD  END OF SESSION:  OT End of Session - 11/05/23 0850     Visit Number 6    Number of Visits 9    Date for OT Re-Evaluation 11/11/23    Authorization Type [employer] Medicare 2025 VL: [place] Auth Rqd[place] [employer]    Authorization Time Period 6 OT visits 5/27-7/22/25    Authorization - Number of Visits 6    OT Start Time 0850    OT Stop Time 0930    OT Time Calculation (min) 40 min    Equipment Utilized During Treatment Pink putty    Activity Tolerance Patient tolerated treatment well    Behavior During Therapy WFL for tasks assessed/performed          Past Medical History:  Diagnosis Date   Arthritis    right knee    Bradycardia    Cancer (HCC)    melanoma on right shoulder , skin cancer    Chronic kidney disease    stage III   Complication of anesthesia    memory problems   Hypertension    Plaque psoriasis    Pneumonia    hx of years ago    Psoriasis    Sleep apnea    cpap - heated machine 9 setting    Past Surgical History:  Procedure Laterality Date   COLONOSCOPY     INCISIONAL HERNIA REPAIR N/A 06/12/2017   Procedure: OPEN VENTRAL INCISIONAL HERNIA REPAIR WITH MESH;  Surgeon: Eletha Boas, MD;  Location: [facility];  Service: General;  Laterality: N/A;   INSERTION OF MESH N/A 06/12/2017   Procedure: INSERTION OF MESH;  Surgeon: Eletha Boas, MD;  Location: [facility];  Service: General;  Laterality: N/A;   JOINT REPLACEMENT     right total knee   LAPAROSCOPIC PARTIAL COLECTOMY N/A 03/28/2016   Procedure: LAPAROSCOPIC ASSISTED  EXTENDED RIGHT COLECTOMY;  Surgeon: Boas Russell, MD;  Location: [facility];  Service: General;  Laterality: N/A;   melanoma removed from right shoulder      TOTAL KNEE ARTHROPLASTY Right 01/27/2017   Procedure: RIGHT  TOTAL KNEE ARTHROPLASTY;  Surgeon: Melodi Lerner, MD;  Location: [facility];  Service: Orthopedics;  Laterality: Right;   Patient Active Problem List   Diagnosis Date Noted   Primary hypertension 08/27/2023   Precordial pain 07/07/2023   Paroxysmal atrial fibrillation (HCC) 07/07/2023   OSA (obstructive sleep apnea) 07/07/2023   Mixed hyperlipidemia 07/07/2023   Incisional hernia 06/08/2017   OA (osteoarthritis) of knee 01/27/2017   Dysplastic polyp of colon 03/28/2016    ONSET DATE: 08/22/2023  REFERRING DIAG:  F74.467 (ICD-10-CM) - Pain in left wrist  M25.531 (ICD-10-CM) - Pain in right wrist  THERAPY DIAG:  Pain in right wrist  Pain in left wrist  Muscle weakness (generalized)  Other lack of coordination  Rationale for Evaluation and Treatment: Rehabilitation  SUBJECTIVE:   SUBJECTIVE STATEMENT: Patient goes by Adam Daniel  Pt reports the putty has been good and he has been swinging a golf club up to 20 times/day with good success ie) no increased pain, as he is being careful to keep his wrist straight.   Pt reports he has moved closer to the middle of the bed to minimize risk of rolling out of bed.    Pt accompanied by: self + daughter waiting  in the car but seen at end of session  PERTINENT HISTORY:  PMHx: Hypertension, Cervical disc disease, Depression, psoriasis, Melanoma r shoulder 2012, DJD right knee bone-on-bone, stage III renal disease 06/2018, Sleep apnea, 2 cm adenocarcinoma colon resected 03/2016 repeat colon 04/2017 done Dr Burnette repeat done 05/2020 small adenoma, 10/2016 1mm cyst tendon left 1st finger, retinal flashing and laser treatment Dr [facility]  Musculoskeletal: Positive for bilateral wrist pain, worse in right wrist. Pain localized to back of wrist. Positive for crunching sensation in wrist. Positive for difficulty opening pinky finger (possible trigger finger)  Severe thumb carpometacarpal, triscaphe, hamate-triquetrum,capitate-lunate and  radial-scaphoid osteoarthritis. Probable remote triquetral avulsion fracture.  X-rays reveal osteoarthritis in both wrists, with the left wrist showing more severe changes including joint space narrowing and extra bone growth in the ulna. The right wrist demonstrates some irregularity and decreased joint space. Pain is localized to the back of the wrist, with no radiation to the fingers.  Plan:- Refer to hand physical therapy for wrist strengthening exercises- Follow-up appointment in 10-12 weeks- Consider corticosteroid injections if pain becomes severe (informed patient of potential side effects including tendon weakness)            PRECAUTIONS: None  WEIGHT BEARING RESTRICTIONS: No  PAIN:  Are you having pain? None upon arrival for therapy today Much improvement with putty 1x/day and golf club - everyday x 5 days x 4 times  FALLS: Has patient fallen in last 6 months? Yes. Number of falls 6/23 - rolled out of bed during a dream and landed on his hands and forehead on the floor   LIVING ENVIRONMENT: Pt and wife (who has Alzheimer's) recently bought a new home and moved in with his daughter and family Lives with: lives with their family and lives with their daughter, granddaughter 60 yo Lives in: House/apartment Stairs: Yes: External: 3 steps; on right going up in the garage and bilateral but cannot reach both at the front - tends to use the garage Has following equipment at home: shower chair, Grab bars, and walk in shower, raised toilet seat  PLOF: Independent Married 57 years, wife has Alzheimer and he was caring for her until they recently moved in with their daughter and family.  Pt has 2 daughters.  Pt has not been driving lately.  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  HAND DOMINANCE: Left -  ambidextrous  ADLs: Overall ADLs: Mod Ind Transfers/ambulation related to ADLs: Ind Eating: Ind Grooming: Ind UB Dressing: Ind LB Dressing: Ind Toileting: Using elevated seat Bathing: Mod Ind Tub Shower transfers: Mod Ind Equipment: Grab bars, Walk in shower, and built in seat as well as raised toilet seat  IADLs: Shopping: Goes on field trips with his daughter Light housekeeping: TBD Meal Prep: daughter - will reheat food - microwave/stove Community mobility: Ind Medication management: Ind and wife's meds and weekly pill box Financial management: Ind Handwriting: TBA  MOBILITY STATUS: Independent  POSTURE COMMENTS:  rounded shoulders and forward head Sitting balance: WFL  ACTIVITY TOLERANCE: Activity tolerance: Good  FUNCTIONAL OUTCOME MEASURES: TBD  UPPER EXTREMITY ROM:   BUE - WFL  UPPER EXTREMITY MMT:     BUE - 4+/5  HAND FUNCTION: Grip strength: Right: 49.8, 55, 56.2  lbs; Left: 60.4, 56.8, 59.0, 64.1 lbs Average: Right 53.7 lbs Left 60.1 lbs  Crunchy noises with BUEs with squeezing  COORDINATION: 9 Hole Peg test: Right: 23.75 sec; Left: 23.75 sec  SENSATION: WFL  EDEMA: wrists swell sometimes  COGNITION: Overall cognitive status: Within functional limits for tasks assessed  OBSERVATIONS: Pt ambulates without AE and no loss of balance. The pt is well kept and was accompanied by his daughter who reports some improvement in his pain since moving in with them as he does not have to do as much to help with taking care of the home or his spouse. Pt and daughter were attentive with daughter reiterating education back to OT/father throughout session. Pt had audible crunching in hands when conducting grip strength testing today also.                                                                                                                            TODAY'S TREATMENT:    Therapeutic Activities:  Reviewed Putty Exercises with pink putty for  maintenance of strength and coordination of B UEs with pt able to provide visual demonstration of the various exercises/activities he has been performing at home ie) Putty Squeezes, Pinch and Pull with Putty, Finger Extension with Putty and Finger Adduction with Putty   Self Care:  Discussed with the patient discharge instructions and all questions fully answered. He is encouraged to call MD if future referral is needed to return to OT.  Pt is encouraged to use his strategies to keep pain minimized ie) use splints if needed, gentle ROM and activity, use heat (hand warmer found online as an option) and adaptive equipment with spring loaded scissor option also provided as an option from [employer] and pt reports getting the automatic jar opener for home already. SABRA  PATIENT EDUCATION: Education details: DC instructions, Jt Protection, Putty Activities Person educated: Patient Education method: Explanation, Demonstration, Tactile cues, Verbal cues, and Handouts Education comprehension: verbalized understanding, returned demonstration, and verbal cues required  HOME EXERCISE PROGRAM: 09/16/23: Tendon Gliding Exercises 10/02/23: Joint Protection 10/08/23: Coordination Activities 10/28/23: Putty Activities Access Code: [email or web address]  GOALS: Goals reviewed with patient? Yes    SHORT TERM GOALS: Target date: 10/20/23   Patient will demonstrate initial BUE HEP with 25% verbal cues or less for proper execution. Baseline: New to outpt OT Goal status: MET - Tendon Gliding Exc issued at eval.   2.  Pt to trial prefab hand splints to help improve pain and improve overall comfort for functional use of RUE. Recommend prefab vs fab due to chronicity of issue, adjustability, comfort, and ease of cleaning. Baseline: No splints Goal status: MET - R wrist cock up splint   3.  Pt will independently recall at least 3 joint protection, ergonomics, and body mechanic principles as noted in pt instructions to assist with daily  tasks with increased comfort and confidence.  Baseline:  New to outpt OT Goal status: MET - Introduced at eval & handouts at visit #2 DC - Reports options including splints, heat, AE (  jar opener) and modified grasp ie) keeps wrist neutral   4.  Patient will demonstrate no loss in grip strength and increased confidence in his grip as needed to open jars and other containers. Baseline: Right 53.7 lbs Left 60.1 lbs  Goal status: Partially met L maintained, R decreased 11/05/23: Right: 43.4, 45.1 Left: 63.0, 59.7  - Still notes crunchy feelings in wrist   5.  Pt will independently recall sleep positioning options as noted in pt instructions to minimize sleep disturbances to minimal.  Baseline:  New to outpt OT Goal status: MET   LONG TERM GOALS: Target date: 11/20/23   Patient will demonstrate updated BUE HEP with visual instruction only for proper execution. Baseline: New to outpt OT Goal status: METs - Tendon Gliding Exc issued at eval. 10/08/23: + Coordination Activities  10/28/23: + Putty Activities   2.  Pt will independently recall at least 3 options for adaptive equipment to assist with joint protection, ergonomics, and body mechanic principles as noted in pt instructions for ADLs and IADLS.  Baseline:  New to outpt OT Goal status: MET 10/13/23: Jar opener, pill box, adapted cup and idea re: spring loaded scissors  3.  Pt will be independent with home based pain management routine to potentially include gloves/splints, heat and joint protection principles for minimal pain <3/10 Baseline: 5/10 Goal status: MET    ASSESSMENT:  CLINICAL IMPRESSION: Patient is a 78 y.o. male who was seen today for occupational therapy discharge s/p treatment for B wrist pain, determined to be OA changes of bilateral wrists and bilateral CMC OA changes as well. Patient reporting great improvement in overall comfort of B UEs with splints, putty activities and AE recommendations.  Patient is appropriate for  discharge and no longer demonstrates medical necessity for continued skilled occupational therapy services  PERFORMANCE DEFICITS: in functional skills including ADLs, IADLs, coordination, dexterity, edema, ROM, strength, pain, flexibility, Fine motor control, Gross motor control, body mechanics, endurance, decreased knowledge of precautions, decreased knowledge of use of DME, and UE functional use, cognitive skills including problem solving, and psychosocial skills including coping strategies, environmental adaptation, habits, and routines and behaviors.   IMPAIRMENTS: are limiting patient from ADLs, IADLs, rest and sleep, leisure, and social participation.   CO-MORBIDITIES: may have co-morbidities  that affects occupational performance. Patient will benefit from skilled OT to address above impairments and improve overall function.  REHAB POTENTIAL: Good   OCCUPATIONAL THERAPY DISCHARGE SUMMARY  Visits from Start of Care: 6  Current functional level related to goals / functional outcomes: Pt has met all goals to satisfactory levels and is pleased with outcomes.   Remaining deficits: Pt has no more significant functional deficits or pain.   Education / Equipment: Pt has all needed materials and education. Pt understands how to continue on with self-management. See tx notes for more details.   Patient agrees to discharge due to max benefits received from outpatient occupational therapy / hand therapy at this time.    Clarita LITTIE Pride, OT 11/05/2023, 5:46 PM

## 2023-12-16 ENCOUNTER — Other Ambulatory Visit: Payer: Self-pay | Admitting: Cardiology

## 2023-12-16 NOTE — Telephone Encounter (Signed)
 Prescription refill request for Eliquis  received. Indication: AF Last office visit: 08/27/23  CHRISTELLA Kee MD Scr: 2.48 on 10/08/23  [facility] Age: 78 Weight: 85.7kg  Based on above findings Eliquis  2.5mg  twice daily is the appropriate dose.  Refill approved.

## 2024-03-25 ENCOUNTER — Other Ambulatory Visit: Payer: Self-pay | Admitting: Cardiology

## 2024-05-17 ENCOUNTER — Ambulatory Visit: Admitting: Cardiology

## 2024-08-03 ENCOUNTER — Ambulatory Visit: Admitting: Cardiology
